# Patient Record
Sex: Male | Born: 1985 | Race: White | Hispanic: No | Marital: Married | Smoking: Never smoker
Health system: Southern US, Community
[De-identification: ages and names within clinical notes are randomized; demographics above are authoritative.]

## PROBLEM LIST (undated history)

## (undated) DIAGNOSIS — I10 Essential (primary) hypertension: Secondary | ICD-10-CM

## (undated) DIAGNOSIS — G43909 Migraine, unspecified, not intractable, without status migrainosus: Secondary | ICD-10-CM

## (undated) DIAGNOSIS — M109 Gout, unspecified: Secondary | ICD-10-CM

## (undated) HISTORY — DX: Essential (primary) hypertension: I10

## (undated) HISTORY — DX: Gout, unspecified: M10.9

## (undated) HISTORY — DX: Migraine, unspecified, not intractable, without status migrainosus: G43.909

## (undated) HISTORY — PX: ARTHOSCOPIC ROTAOR CUFF REPAIR: SHX5002

---

## 2019-12-13 ENCOUNTER — Ambulatory Visit: Payer: 59 | Admitting: Family

## 2019-12-30 ENCOUNTER — Other Ambulatory Visit: Payer: Self-pay

## 2019-12-30 ENCOUNTER — Ambulatory Visit (INDEPENDENT_AMBULATORY_CARE_PROVIDER_SITE_OTHER): Payer: 59 | Admitting: Family

## 2019-12-30 ENCOUNTER — Encounter: Payer: Self-pay | Admitting: Family

## 2019-12-30 VITALS — Ht 66.0 in | Wt 290.0 lb

## 2019-12-30 DIAGNOSIS — R0683 Snoring: Secondary | ICD-10-CM

## 2019-12-30 DIAGNOSIS — G4730 Sleep apnea, unspecified: Secondary | ICD-10-CM | POA: Insufficient documentation

## 2019-12-30 DIAGNOSIS — R519 Headache, unspecified: Secondary | ICD-10-CM | POA: Insufficient documentation

## 2019-12-30 DIAGNOSIS — K429 Umbilical hernia without obstruction or gangrene: Secondary | ICD-10-CM | POA: Diagnosis not present

## 2019-12-30 DIAGNOSIS — R229 Localized swelling, mass and lump, unspecified: Secondary | ICD-10-CM

## 2019-12-30 DIAGNOSIS — Z807 Family history of other malignant neoplasms of lymphoid, hematopoietic and related tissues: Secondary | ICD-10-CM

## 2019-12-30 DIAGNOSIS — Z7689 Persons encountering health services in other specified circumstances: Secondary | ICD-10-CM

## 2019-12-30 HISTORY — DX: Headache, unspecified: R51.9

## 2019-12-30 NOTE — Assessment & Plan Note (Signed)
Exam certainly limited as exam was conducted in a virtual environment.  Based on description, hernia does not appear to be obstructed or strangulated.  He is not having any severe pain.  I have advised him to be vigilant in regards to pain, redness, increased heat or other concerns for infection and to let us know immediately. Referral is in place to surgery for further evaluation

## 2019-12-30 NOTE — Assessment & Plan Note (Signed)
Agree with patient that the sleep study evaluation is appropriate.  Particularly in the setting of his headaches, nonrestorative sleep.  Referral is in place to pulmonology.  Will follow

## 2019-12-30 NOTE — Progress Notes (Signed)
Virtual Visit via Video Note  I connected with@  on 12/30/19 at  3:00 PM EST by a video enabled telemedicine application and verified that I am speaking with the correct person using two identifiers.  Location patient: home Location provider:work  Persons participating in the virtual visit: patient, provider  I discussed the limitations of evaluation and management by telemedicine and the availability of in person appointments. The patient expressed understanding and agreed to proceed.   HPI: Establish care  Concerned for umbilical hernia, noticed one year ago.  No severe pain, erythema, fever, purulent discharge. Protrudes when cough. Reducible per patient. Can really tell is it there, it 'is not painful.' Concerned that he snores and 'has sleep apnea'. Doesn't feel restored after sleep, also has headaches.  H/o migraine and has followed with neurology in the past. HA may start posterior and radiate to the front, or may feel frontal. No vision changes, aura. Endorses N, v. Will have one migraine per week.  Has had MRI Brain, CT Head and 'have been normal. '  HA is not positional , nor awaken him from sleep.  HA resolves with rest, sitting in dark room. Uses excredrin migraine with relief.   Concerned for 'cyst ' on back, noticed for 3 months. Has been there for some time.  No purulent discharge.   H/o HTN in highschool ; had been on HCTZ in the past. hasnt checked blood pressure recently.  Denies exertional chest pain or pressure, numbness or tingling radiating to left arm or jaw, palpitations, dizziness, hanges in vision, or shortness of breath.   At urgent care last year, BP 137/87.  He also shared that his paternal grandfather has a history of multiple myeloma and his father history of elevated ' M protein'.  He would like to know how he can be screened for this.  Denies bone pain.   ROS: See pertinent positives and negatives per HPI.  Past Medical History:  Diagnosis Date  .  Hypertension   . Migraines     Past Surgical History:  Procedure Laterality Date  . ARTHOSCOPIC ROTAOR CUFF REPAIR Left     Family History  Problem Relation Age of Onset  . Diabetes Mother   . Sleep apnea Mother   . Hypertension Father        elevated M proteins  . Hypothyroidism Sister   . Diabetes Maternal Grandmother   . Prostate cancer Maternal Grandfather   . Skin cancer Paternal Grandmother   . Multiple myeloma Paternal Grandfather     SOCIAL HX: never smoker  No current outpatient medications on file.  EXAM:  VITALS per patient if applicable:  GENERAL: alert, oriented, appears well and in no acute distress  HEENT: atraumatic, conjunttiva clear, no obvious abnormalities on inspection of external nose and ears  NECK: normal movements of the head and neck  LUNGS: on inspection no signs of respiratory distress, breathing rate appears normal, no obvious gross SOB, gasping or wheezing  CV: no obvious cyanosis  MS: moves all visible extremities without noticeable abnormality  PSYCH/NEURO: pleasant and cooperative, no obvious depression or anxiety, speech and thought processing grossly intact  ASSESSMENT AND PLAN:  Discussed the following assessment and plan:  Encounter to establish care  Umbilical hernia without obstruction and without gangrene - Plan: Ambulatory referral to General Surgery  Snores - Plan: Ambulatory referral to Pulmonology  Skin mass - Plan: Ambulatory referral to General Surgery  Family history of multiple myeloma  Nonintractable headache, unspecified chronicity pattern,  unspecified headache type Problem List Items Addressed This Visit      Other   Encounter to establish care - Primary    Reviewed past medical and family history with patient today      Family history of multiple myeloma    PGM had multiple myeloma.  Advised that we could add labs onto CPE. Have sent Dr Tasia Catchings a message in regards to surveillance, appropriate labs       Headache    History of migraine and has seen neurology in the past.   declines any medication at this time.  He would like to further understand the reason behind his headaches and he is concerned for sleep apnea.  .  We will follow up after he has had a sleep study.      Skin mass    Based on location of mass, patient is unable to show me this with his phone.  Description is vague however there has not been a change in size and there are concern for infections based on pain, redness, increased heat or discharge.  He will ask his wife to stay vigilant in regards to this.  Discussed whether this could be a sebaceous cyst.  At this time, patient is already going to general surgery for evaluation of hernia, we felt was appropriate for surgeon to evaluate suspected cyst ( ? Lipoma)  as well as hernia.  Will follow      Relevant Orders   Ambulatory referral to General Surgery   Snores    Agree with patient that the sleep study evaluation is appropriate.  Particularly in the setting of his headaches, nonrestorative sleep.  Referral is in place to pulmonology.  Will follow      Relevant Orders   Ambulatory referral to Pulmonology   Umbilical hernia    Exam certainly limited as exam was conducted in a virtual environment.  Based on description, hernia does not appear to be obstructed or strangulated.  He is not having any severe pain.  I have advised him to be vigilant in regards to pain, redness, increased heat or other concerns for infection and to let us know immediately. Referral is in place to surgery for further evaluation      Relevant Orders   Ambulatory referral to General Surgery      -we discussed possible serious and likely etiologies, options for evaluation and workup, limitations of telemedicine visit vs in person visit, treatment, treatment risks and precautions. Pt prefers to treat via telemedicine empirically rather then risking or undertaking an in person visit at this moment.  Patient agrees to seek prompt in person care if worsening, new symptoms arise, or if is not improving with treatment.   I discussed the assessment and treatment plan with the patient. The patient was provided an opportunity to ask questions and all were answered. The patient agreed with the plan and demonstrated an understanding of the instructions.   The patient was advised to call back or seek an in-person evaluation if the symptoms worsen or if the condition fails to improve as anticipated.   Mable Paris, FNP

## 2019-12-30 NOTE — Assessment & Plan Note (Signed)
History of migraine and has seen neurology in the past.   declines any medication at this time.  He would like to further understand the reason behind his headaches and he is concerned for sleep apnea.  .  We will follow up after he has had a sleep study.

## 2019-12-30 NOTE — Assessment & Plan Note (Signed)
Based on location of mass, patient is unable to show me this with his phone.  Description is vague however there has not been a change in size and there are concern for infections based on pain, redness, increased heat or discharge.  He will ask his wife to stay vigilant in regards to this.  Discussed whether this could be a sebaceous cyst.  At this time, patient is already going to general surgery for evaluation of hernia, we felt was appropriate for surgeon to evaluate suspected cyst ( ? Lipoma)  as well as hernia.  Will follow

## 2019-12-30 NOTE — Assessment & Plan Note (Addendum)
PGM had multiple myeloma.  Advised that we could add labs onto CPE. Have sent Dr Tasia Catchings a message in regards to surveillance, appropriate labs

## 2019-12-30 NOTE — Assessment & Plan Note (Signed)
Reviewed past medical and family history with patient today

## 2020-01-04 ENCOUNTER — Other Ambulatory Visit: Payer: Self-pay

## 2020-01-04 ENCOUNTER — Ambulatory Visit
Admission: EM | Admit: 2020-01-04 | Discharge: 2020-01-04 | Disposition: A | Discharge status: Home / Self Care | Payer: 59 | Attending: Emergency Medicine | Admitting: Emergency Medicine

## 2020-01-04 DIAGNOSIS — M25562 Pain in left knee: Secondary | ICD-10-CM | POA: Diagnosis not present

## 2020-01-04 MED ORDER — PREDNISONE 10 MG (21) PO TBPK: ORAL_TABLET | Freq: Every day | ORAL | 0 refills | Status: DC

## 2020-01-04 NOTE — Discharge Instructions (Signed)
Take the prednisone as directed.  Continue to take ibuprofen as needed for discomfort.    Rest and elevate your hand.  Apply ice packs 2-3 times a day for up to 20 minutes each.  Wear the Ace wrap.    Follow up with your primary care provider or an orthopedist if you symptoms continue or worsen;  Or if you develop new symptoms, such as numbness, tingling, or weakness.

## 2020-01-04 NOTE — ED Provider Notes (Addendum)
Michael Hardin    CSN: [id number] Arrival date & time: 01/04/20  1004      History   Chief Complaint Chief Complaint  Patient presents with  . Knee Pain    HPI Michael Hardin is a 34 y.o. male.   Patient presents with left knee pain x2 days.  He states he awoke to sudden sharp pain on Saturday night.  He denies falls or injury or known cause.  He denies numbness, tingling, weakness.  He states the pain is 7/10, sharp, constant, worse with bending and movement, improves with rest.  Treatment attempted at home with ibuprofen.  The history is provided by the patient.    Past Medical History:  Diagnosis Date  . Hypertension   . Migraines     Patient Active Problem List   Diagnosis Date Noted  . Umbilical hernia 59/16/3846  . Snores 12/30/2019  . Skin mass 12/30/2019  . Family history of multiple myeloma 12/30/2019  . Encounter to establish care 12/30/2019  . Headache 12/30/2019    Past Surgical History:  Procedure Laterality Date  . ARTHOSCOPIC ROTAOR CUFF REPAIR Left        Home Medications    Prior to Admission medications   Medication Sig Start Date End Date Taking? Authorizing Provider  predniSONE (STERAPRED UNI-PAK 21 TAB) 10 MG (21) TBPK tablet Take by mouth daily. Take 6 tabs by mouth daily  for 1 day, then 5 tabs for 1 day, then 4 tabs for 1 day, then 3 tabs for 1 day, 2 tabs for 1 day, then 1 tab by mouth daily for 1 day 01/04/20   Sharion Balloon, NP    Family History Family History  Problem Relation Age of Onset  . Diabetes Mother   . Sleep apnea Mother   . Hypertension Father        elevated M proteins  . Hypothyroidism Sister   . Diabetes Maternal Grandmother   . Prostate cancer Maternal Grandfather   . Skin cancer Paternal Grandmother   . Multiple myeloma Paternal Grandfather     Social History Social History   Tobacco Use  . Smoking status: Never Smoker  . Smokeless tobacco: Never Used  Substance Use Topics  . Alcohol use: Not  Currently  . Drug use: Not Currently     Allergies   Patient has no known allergies.   Review of Systems Review of Systems  Constitutional: Negative for chills and fever.  HENT: Negative for ear pain and sore throat.   Eyes: Negative for pain and visual disturbance.  Respiratory: Negative for cough and shortness of breath.   Cardiovascular: Negative for chest pain and palpitations.  Gastrointestinal: Negative for abdominal pain and vomiting.  Genitourinary: Negative for dysuria and hematuria.  Musculoskeletal: Positive for arthralgias. Negative for back pain.  Skin: Negative for color change and rash.  Neurological: Negative for seizures, syncope, weakness and numbness.  All other systems reviewed and are negative.    Physical Exam Triage Vital Signs ED Triage Vitals  Enc Vitals Group     BP      Pulse      Resp      Temp      Temp src      SpO2      Weight      Height      Head Circumference      Peak Flow      Pain Score      Pain Loc  Pain Edu?      Excl. in [place]?    No data found.  Updated Vital Signs BP (!) 141/93 (BP Location: Left Arm)   Pulse 73   Temp 98.3 F (36.8 C) (Oral)   Resp 18   Ht 5' 6"  (1.676 m)   Wt 300 lb (136.1 kg)   SpO2 97%   BMI 48.42 kg/m   Visual Acuity Right Eye Distance:   Left Eye Distance:   Bilateral Distance:    Right Eye Near:   Left Eye Near:    Bilateral Near:     Physical Exam Vitals and nursing note reviewed.  Constitutional:      Appearance: He is well-developed.  HENT:     Head: Normocephalic and atraumatic.     Mouth/Throat:     Mouth: Mucous membranes are moist.  Eyes:     Conjunctiva/sclera: Conjunctivae normal.  Cardiovascular:     Rate and Rhythm: Normal rate and regular rhythm.     Heart sounds: No murmur.  Pulmonary:     Effort: Pulmonary effort is normal. No respiratory distress.     Breath sounds: Normal breath sounds.  Abdominal:     General: Bowel sounds are normal.      Palpations: Abdomen is soft.     Tenderness: There is no abdominal tenderness. There is no guarding or rebound.  Musculoskeletal:        General: Tenderness present. No swelling, deformity or signs of injury. Normal range of motion.     Cervical back: Neck supple.     Left knee: No swelling, deformity, effusion, erythema or ecchymosis. Tenderness present.     Right lower leg: No edema.     Left lower leg: No edema.       Legs:  Skin:    General: Skin is warm and dry.     Capillary Refill: Capillary refill takes less than 2 seconds.     Findings: No bruising, erythema, lesion or rash.  Neurological:     General: No focal deficit present.     Mental Status: He is alert and oriented to person, place, and time.     Sensory: No sensory deficit.     Motor: No weakness.  Psychiatric:        Mood and Affect: Mood normal.        Behavior: Behavior normal.      UC Treatments / Results  Labs (all labs ordered are listed, but only abnormal results are displayed) Labs Reviewed - No data to display  EKG   Radiology No results found.  Procedures Procedures (including critical care time)  Medications Ordered in UC Medications - No data to display  Initial Impression / Assessment and Plan / UC Course  I have reviewed the triage vital signs and the nursing notes.  Pertinent labs & imaging results that were available during my care of the patient were reviewed by me and considered in my medical decision making (see chart for details).    Left knee pain.  Treating with prednisone, Ace wrap, rest, elevation, ice packs.  Instructed patient to continue taking ibuprofen as needed for discomfort.  Instructed him to follow-up with his PCP or an orthopedist if his symptoms persist or he develops new symptoms such as numbness, weakness, paresthesias.  Patient agrees to plan of care.     Final Clinical Impressions(s) / UC Diagnoses   Final diagnoses:  Acute pain of left knee      Discharge Instructions  Take the prednisone as directed.  Continue to take ibuprofen as needed for discomfort.    Rest and elevate your hand.  Apply ice packs 2-3 times a day for up to 20 minutes each.  Wear the Ace wrap.    Follow up with your primary care provider or an orthopedist if you symptoms continue or worsen;  Or if you develop new symptoms, such as numbness, tingling, or weakness.         ED Prescriptions    Medication Sig Dispense Auth. Provider   predniSONE (STERAPRED UNI-PAK 21 TAB) 10 MG (21) TBPK tablet Take by mouth daily. Take 6 tabs by mouth daily  for 1 day, then 5 tabs for 1 day, then 4 tabs for 1 day, then 3 tabs for 1 day, 2 tabs for 1 day, then 1 tab by mouth daily for 1 day 21 tablet Sharion Balloon, NP     PDMP not reviewed this encounter.   Sharion Balloon, NP 01/04/20 1036    Sharion Balloon, NP 01/04/20 1124

## 2020-01-04 NOTE — ED Triage Notes (Signed)
Patient states that he started having left knee pain on Saturday. Denies any known injury. States that pain is absent if knee is straight, worsens with bending. States that he has a history of gout and is concerned for this.

## 2020-01-05 ENCOUNTER — Ambulatory Visit (INDEPENDENT_AMBULATORY_CARE_PROVIDER_SITE_OTHER): Payer: 59 | Admitting: Surgery

## 2020-01-05 ENCOUNTER — Encounter: Payer: Self-pay | Admitting: Surgery

## 2020-01-05 ENCOUNTER — Encounter: Payer: Self-pay | Admitting: Family

## 2020-01-05 ENCOUNTER — Other Ambulatory Visit: Payer: Self-pay

## 2020-01-05 VITALS — BP 159/97 | HR 99 | Temp 97.0°F | Resp 16 | Ht 63.0 in | Wt 306.0 lb

## 2020-01-05 DIAGNOSIS — K429 Umbilical hernia without obstruction or gangrene: Secondary | ICD-10-CM

## 2020-01-05 DIAGNOSIS — L723 Sebaceous cyst: Secondary | ICD-10-CM | POA: Diagnosis not present

## 2020-01-05 NOTE — Patient Instructions (Addendum)
Try to maintain a healthy weight to see if this helps with the discomfort. If you decide to move forward with surgery let us know and we can get this scheduled for you.  Please call our office if you have any questions or concerns.  You may apply warm compresses to the area on your upper back.

## 2020-01-05 NOTE — Progress Notes (Signed)
Patient ID: Michael Hardin, male   DOB: [birth year], 34 y.o.   MRN: [id number]  Chief Complaint:   Umbilical hernia, dermal lesion on back.  History of Present Illness Michael Hardin is a 34 y.o. male with a year and a half history of discomfort notable at the umbilical region.  No changes in bowel function, denies history of constipation, diarrhea, fever, nausea.  Pain does not seem to be associated with any particular function or activity.  He denies any association to cough or sneezing.  Would describe it is more of a discomfort than a sharp pain. Also has a history of a dermal lesion of his right upper back for approximately 3 months.  He denies drainage, has attempted manipulation without alleviation.  Otherwise asymptomatic, nontender no itching, no discoloration, no rash.  Past Medical History Past Medical History:  Diagnosis Date  . Hypertension   . Migraines       Past Surgical History:  Procedure Laterality Date  . ARTHOSCOPIC ROTAOR CUFF REPAIR Left     No Known Allergies  Current Outpatient Medications  Medication Sig Dispense Refill  . predniSONE (STERAPRED UNI-PAK 21 TAB) 10 MG (21) TBPK tablet Take by mouth daily. Take 6 tabs by mouth daily  for 1 day, then 5 tabs for 1 day, then 4 tabs for 1 day, then 3 tabs for 1 day, 2 tabs for 1 day, then 1 tab by mouth daily for 1 day 21 tablet 0   No current facility-administered medications for this visit.    Family History Family History  Problem Relation Age of Onset  . Diabetes Mother   . Sleep apnea Mother   . Hypertension Father        elevated M proteins  . Hypothyroidism Sister   . Diabetes Maternal Grandmother   . Prostate cancer Maternal Grandfather   . Skin cancer Paternal Grandmother   . Multiple myeloma Paternal Grandfather       Social History Social History   Tobacco Use  . Smoking status: Never Smoker  . Smokeless tobacco: Never Used  Substance Use Topics  . Alcohol use: Not Currently  . Drug use:  Not Currently        Review of Systems  Constitutional: Negative for chills, fever, malaise/fatigue and weight loss.  HENT: Negative.   Eyes: Negative.   Respiratory: Negative.   Cardiovascular: Negative.   Gastrointestinal: Negative for abdominal pain, constipation, diarrhea, nausea and vomiting.  Genitourinary: Negative.   Musculoskeletal: Positive for joint pain.  Skin: Negative for itching and rash.  Neurological: Negative.   Endo/Heme/Allergies: Negative.       Physical Exam Blood pressure (!) 159/97, pulse 99, temperature (!) 97 F (36.1 C), temperature source Temporal, resp. rate 16, height '5\' 3"'$  (1.6 m), weight (!) 306 lb (138.8 kg), SpO2 97 %. Last Weight  Most recent update: 01/05/2020  9:22 AM   Weight  138.8 kg (306 lb)              CONSTITUTIONAL: Well developed, and nourished, appropriately responsive and aware without distress.  Morbidly obese. EYES: Sclera non-icteric.   EARS, NOSE, MOUTH AND THROAT: Mask worn.   Hearing is intact to voice.  NECK: Trachea is midline.  LYMPH NODES:  Lymph nodes in the neck are not enlarged. RESPIRATORY:  Lungs are clear, and breath sounds are equal bilaterally. Normal respiratory effort without pathologic use of accessory muscles. CARDIOVASCULAR: Heart is regular in rate and rhythm. GI: The abdomen is well rounded,  soft, nontender, and nondistended.  There is a defect at the umbilical region causing the crescents shaped opening, with asymmetry of approximately a 1.5 cm fleshy lump.  This was tender on attempts to palpate the underlying fascial defect, consistent with an incarcerated preperitoneal lipoma.  There was associated diastases recti.  There were no other palpable masses. I did not appreciate hepatosplenomegaly. There were normal bowel sounds. MUSCULOSKELETAL:  Symmetrical muscle tone appreciated in all four extremities.    SKIN: Skin turgor is normal. No pathologic skin lesions appreciated.  There is a subcentimeter dermal  cyst on the right parascapular area without evidence of inflammatory changes, or overlying skin changes. NEUROLOGIC:  Motor and sensation appear grossly normal.  Cranial nerves are grossly without defect. PSYCH:  Alert and oriented to person, place and time. Affect is appropriate for situation.  Data Reviewed I have personally reviewed what is currently available of the patient's imaging, recent labs and medical records.   Labs:  No flowsheet data found. No flowsheet data found.    Imaging:  Within last 24 hrs: No results found.  Assessment    Small, symptomatic umbilical hernia defect. Dermal cyst, likely sebaceous right parascapular area subcentimeter in size. Patient Active Problem List   Diagnosis Date Noted  . Umbilical hernia 56/38/7564  . Snores 12/30/2019  . Skin mass 12/30/2019  . Family history of multiple myeloma 12/30/2019  . Encounter to establish care 12/30/2019  . Headache 12/30/2019    Plan    We discussed avoidance of manipulation of the cyst.  We discussed that weight loss will likely diminish the symptomatic nature of the rather small umbilical fascial defect.  We also discussed surgery to obliterate the defect with suture, likely not to warrant any utilization of prosthetics i.e. mesh.  He would like to pursue both of these options and defer surgery for now.  He would like to pursue weight loss as a general health measure and we encouraged him in this.  This may also be of some assistance with his knee pain.  Face-to-face time spent with the patient and accompanying care providers(if present) was 30 minutes, with more than 50% of the time spent counseling, educating, and coordinating care of the patient.      Ronny Bacon M.D., FACS 01/05/2020, 9:56 AM

## 2020-01-06 ENCOUNTER — Other Ambulatory Visit: Payer: Self-pay

## 2020-01-06 MED ORDER — AMLODIPINE BESYLATE 2.5 MG PO TABS: 2.5000 mg | ORAL_TABLET | Freq: Every day | ORAL | 2 refills | Status: DC

## 2020-01-10 ENCOUNTER — Telehealth: Payer: Self-pay | Admitting: Family

## 2020-01-10 DIAGNOSIS — Z807 Family history of other malignant neoplasms of lymphoid, hematopoietic and related tissues: Secondary | ICD-10-CM

## 2020-01-10 DIAGNOSIS — Z7689 Persons encountering health services in other specified circumstances: Secondary | ICD-10-CM

## 2020-01-10 NOTE — Telephone Encounter (Signed)
Call pt  I know he has f/u 2/22 Would he be amenable to labs prior too if he can?   These are fasting and will give [place] baseline.  Also, I consulted with oncology in regards to his family h/o multiple myeloma who agreed that screen appropriate so I have included panel for multiple myeloma.

## 2020-01-11 NOTE — Telephone Encounter (Signed)
LMTCB to get patient scheduled for fasting labs.  

## 2020-01-12 ENCOUNTER — Other Ambulatory Visit (INDEPENDENT_AMBULATORY_CARE_PROVIDER_SITE_OTHER): Payer: 59

## 2020-01-12 ENCOUNTER — Other Ambulatory Visit: Payer: Self-pay

## 2020-01-12 DIAGNOSIS — Z807 Family history of other malignant neoplasms of lymphoid, hematopoietic and related tissues: Secondary | ICD-10-CM

## 2020-01-12 DIAGNOSIS — Z7689 Persons encountering health services in other specified circumstances: Secondary | ICD-10-CM

## 2020-01-12 LAB — CBC WITH DIFFERENTIAL/PLATELET
Basophils Absolute: 0 10*3/uL (ref 0.0–0.1)
Basophils Relative: 0.5 % (ref 0.0–3.0)
Eosinophils Absolute: 0.2 10*3/uL (ref 0.0–0.7)
Eosinophils Relative: 2 % (ref 0.0–5.0)
HCT: 49.8 % (ref 39.0–52.0)
Hemoglobin: 17.2 g/dL — ABNORMAL HIGH (ref 13.0–17.0)
Lymphocytes Relative: 27.5 % (ref 12.0–46.0)
Lymphs Abs: 2.5 10*3/uL (ref 0.7–4.0)
MCHC: 34.5 g/dL (ref 30.0–36.0)
MCV: 89.8 fl (ref 78.0–100.0)
Monocytes Absolute: 0.9 10*3/uL (ref 0.1–1.0)
Monocytes Relative: 10.1 % (ref 3.0–12.0)
Neutro Abs: 5.4 10*3/uL (ref 1.4–7.7)
Neutrophils Relative %: 59.9 % (ref 43.0–77.0)
Platelets: 210 10*3/uL (ref 150.0–400.0)
RBC: 5.55 Mil/uL (ref 4.22–5.81)
RDW: 14.3 % (ref 11.5–15.5)
WBC: 9.1 10*3/uL (ref 4.0–10.5)

## 2020-01-12 LAB — COMPREHENSIVE METABOLIC PANEL
ALT: 30 U/L (ref 0–53)
AST: 13 U/L (ref 0–37)
Albumin: 4.3 g/dL (ref 3.5–5.2)
Alkaline Phosphatase: 67 U/L (ref 39–117)
BUN: 13 mg/dL (ref 6–23)
CO2: 29 mEq/L (ref 19–32)
Calcium: 9.5 mg/dL (ref 8.4–10.5)
Chloride: 101 mEq/L (ref 96–112)
Creatinine, Ser: 0.82 mg/dL (ref 0.40–1.50)
GFR: 109.21 mL/min (ref >60.00)
Glucose, Bld: 89 mg/dL (ref 70–99)
Potassium: 3.8 mEq/L (ref 3.5–5.1)
Sodium: 139 mEq/L (ref 135–145)
Total Bilirubin: 0.8 mg/dL (ref 0.2–1.2)
Total Protein: 7.3 g/dL (ref 6.0–8.3)

## 2020-01-12 LAB — LIPID PANEL
Cholesterol: 164 mg/dL (ref 0–200)
HDL: 37.7 mg/dL — ABNORMAL LOW (ref >39.00)
LDL Cholesterol: 91 mg/dL (ref 0–99)
NonHDL: 126.64
Total CHOL/HDL Ratio: 4
Triglycerides: 180 mg/dL — ABNORMAL HIGH (ref 0.0–149.0)
VLDL: 36 mg/dL (ref 0.0–40.0)

## 2020-01-12 LAB — HEMOGLOBIN A1C: Hgb A1c MFr Bld: 5.2 % (ref 4.6–6.5)

## 2020-01-12 LAB — VITAMIN D 25 HYDROXY (VIT D DEFICIENCY, FRACTURES): VITD: 20.57 ng/mL — ABNORMAL LOW (ref 30.00–100.00)

## 2020-01-12 LAB — TSH: TSH: 2.17 u[IU]/mL (ref 0.35–4.50)

## 2020-01-12 NOTE — Telephone Encounter (Signed)
I called patient & he actually had labs done this morning.

## 2020-01-18 ENCOUNTER — Encounter: Payer: Self-pay | Admitting: Family

## 2020-01-18 ENCOUNTER — Ambulatory Visit (INDEPENDENT_AMBULATORY_CARE_PROVIDER_SITE_OTHER): Payer: 59 | Admitting: Family

## 2020-01-18 ENCOUNTER — Other Ambulatory Visit: Payer: Self-pay

## 2020-01-18 DIAGNOSIS — I10 Essential (primary) hypertension: Secondary | ICD-10-CM | POA: Insufficient documentation

## 2020-01-18 NOTE — Assessment & Plan Note (Signed)
Stable, improved. Tolerating amlodipine. Discussed weight loss and potentially coming off of medication. Patient will let me know of any concerns.

## 2020-01-18 NOTE — Progress Notes (Signed)
Subjective:    Patient ID: Michael Hardin, male    DOB: 1986/06/25, 34 y.o.   MRN: [id number]  CC: Michael Hardin is a 34 y.o. male who presents today for follow up.   HPI: Blood pressure follow up.   Feels well today, no concerns. Compliant with amlodipine. BP at home is 130/80. He has ordered a larger size BP cuff.   Denies exertional chest pain or pressure, numbness or tingling radiating to left arm or jaw, palpitations, dizziness, frequent headaches, changes in vision, or shortness of breath.   Upcoming appointment with pulmonology     HISTORY:  Past Medical History:  Diagnosis Date  . Gout    in ankle  . Hypertension   . Migraines    Past Surgical History:  Procedure Laterality Date  . ARTHOSCOPIC ROTAOR CUFF REPAIR Left    Family History  Problem Relation Age of Onset  . Diabetes Mother   . Sleep apnea Mother   . Hypertension Father        elevated M proteins  . Hypothyroidism Sister   . Diabetes Maternal Grandmother   . Prostate cancer Maternal Grandfather   . Skin cancer Paternal Grandmother   . Multiple myeloma Paternal Grandfather     Allergies: Patient has no known allergies. Current Outpatient Medications on File Prior to Visit  Medication Sig Dispense Refill  . amLODipine (NORVASC) 2.5 MG tablet Take 1 tablet (2.5 mg total) by mouth daily. 30 tablet 2   No current facility-administered medications on file prior to visit.    Social History   Tobacco Use  . Smoking status: Never Smoker  . Smokeless tobacco: Never Used  Substance Use Topics  . Alcohol use: Not Currently  . Drug use: Not Currently    Review of Systems  Constitutional: Negative for chills and fever.  Respiratory: Negative for cough.   Cardiovascular: Negative for chest pain and palpitations.  Gastrointestinal: Negative for nausea and vomiting.      Objective:    BP 100/70   Pulse 76   Temp (!) 97.4 F (36.3 C) (Temporal)   Ht 5' 6"  (1.676 m)   Wt (!) 306 lb 12.8 oz  (139.2 kg)   SpO2 97%   BMI 49.52 kg/m  BP Readings from Last 3 Encounters:  01/18/20 100/70  01/05/20 (!) 159/97  01/04/20 (!) 141/93   Wt Readings from Last 3 Encounters:  01/18/20 (!) 306 lb 12.8 oz (139.2 kg)  01/05/20 (!) 306 lb (138.8 kg)  01/04/20 300 lb (136.1 kg)    Physical Exam Vitals reviewed.  Constitutional:      Appearance: He is well-developed.  Cardiovascular:     Rate and Rhythm: Regular rhythm.     Heart sounds: Normal heart sounds.  Pulmonary:     Effort: Pulmonary effort is normal. No respiratory distress.     Breath sounds: Normal breath sounds. No wheezing, rhonchi or rales.  Skin:    General: Skin is warm and dry.  Neurological:     Mental Status: He is alert.  Psychiatric:        Speech: Speech normal.        Behavior: Behavior normal.        Assessment & Plan:   Problem List Items Addressed This Visit      Cardiovascular and Mediastinum   HTN (hypertension)    Stable, improved. Tolerating amlodipine. Discussed weight loss and potentially coming off of medication. Patient will let me know of any concerns.  I have discontinued Michael Hardin's predniSONE. I am also having him maintain his amLODipine.   No orders of the defined types were placed in this encounter.   Return precautions given.   Risks, benefits, and alternatives of the medications and treatment plan prescribed today were discussed, and patient expressed understanding.   Education regarding symptom management and diagnosis given to patient on AVS.  Continue to follow with Michael Hawthorne, FNP for routine health maintenance.   Michael Hardin and I agreed with plan.   Michael Paris, FNP

## 2020-01-18 NOTE — Patient Instructions (Signed)
Your vitamin D level is low.  You may add over the counter vitamin D 1000 units by mouth daily for 12 weeks. Then you may call our office for a follow up visit and we can recheck level.   Blood pressure looks excellent.   Nice to see you!    Managing Your Hypertension Hypertension is commonly called high blood pressure. This is when the force of your blood pressing against the walls of your arteries is too strong. Arteries are blood vessels that carry blood from your heart throughout your body. Hypertension forces the heart to work harder to pump blood, and may cause the arteries to become narrow or stiff. Having untreated or uncontrolled hypertension can cause heart attack, stroke, kidney disease, and other problems. What are blood pressure readings? A blood pressure reading consists of a higher number over a lower number. Ideally, your blood pressure should be below 120/80. The first ("top") number is called the systolic pressure. It is a measure of the pressure in your arteries as your heart beats. The second ("bottom") number is called the diastolic pressure. It is a measure of the pressure in your arteries as the heart relaxes. What does my blood pressure reading mean? Blood pressure is classified into four stages. Based on your blood pressure reading, your health care provider may use the following stages to determine what type of treatment you need, if any. Systolic pressure and diastolic pressure are measured in a unit called mm Hg. Normal  Systolic pressure: below 798.  Diastolic pressure: below 80. Elevated  Systolic pressure: 921-194.  Diastolic pressure: below 80. Hypertension stage 1  Systolic pressure: 174-081.  Diastolic pressure: 44-81. Hypertension stage 2  Systolic pressure: 856 or above.  Diastolic pressure: 90 or above. What health risks are associated with hypertension? Managing your hypertension is an important responsibility. Uncontrolled hypertension can  lead to:  A heart attack.  A stroke.  A weakened blood vessel (aneurysm).  Heart failure.  Kidney damage.  Eye damage.  Metabolic syndrome.  Memory and concentration problems. What changes can I make to manage my hypertension? Hypertension can be managed by making lifestyle changes and possibly by taking medicines. Your health care provider will help you make a plan to bring your blood pressure within a normal range. Eating and drinking   Eat a diet that is high in fiber and potassium, and low in salt (sodium), added sugar, and fat. An example eating plan is called the DASH (Dietary Approaches to Stop Hypertension) diet. To eat this way: ? Eat plenty of fresh fruits and vegetables. Try to fill half of your plate at each meal with fruits and vegetables. ? Eat whole grains, such as whole wheat pasta, brown rice, or whole grain bread. Fill about one quarter of your plate with whole grains. ? Eat low-fat diary products. ? Avoid fatty cuts of meat, processed or cured meats, and poultry with skin. Fill about one quarter of your plate with lean proteins such as fish, chicken without skin, beans, eggs, and tofu. ? Avoid premade and processed foods. These tend to be higher in sodium, added sugar, and fat.  Reduce your daily sodium intake. Most people with hypertension should eat less than 1,500 mg of sodium a day.  Limit alcohol intake to no more than 1 drink a day for nonpregnant women and 2 drinks a day for men. One drink equals 12 oz of beer, 5 oz of wine, or 1 oz of hard liquor. Lifestyle  Work with  your health care provider to maintain a healthy body weight, or to lose weight. Ask what an ideal weight is for you.  Get at least 30 minutes of exercise that causes your heart to beat faster (aerobic exercise) most days of the week. Activities may include walking, swimming, or biking.  Include exercise to strengthen your muscles (resistance exercise), such as weight lifting, as part of  your weekly exercise routine. Try to do these types of exercises for 30 minutes at least 3 days a week.  Do not use any products that contain nicotine or tobacco, such as cigarettes and e-cigarettes. If you need help quitting, ask your health care provider.  Control any long-term (chronic) conditions you have, such as high cholesterol or diabetes. Monitoring  Monitor your blood pressure at home as told by your health care provider. Your personal target blood pressure may vary depending on your medical conditions, your age, and other factors.  Have your blood pressure checked regularly, as often as told by your health care provider. Working with your health care provider  Review all the medicines you take with your health care provider because there may be side effects or interactions.  Talk with your health care provider about your diet, exercise habits, and other lifestyle factors that may be contributing to hypertension.  Visit your health care provider regularly. Your health care provider can help you create and adjust your plan for managing hypertension. Will I need medicine to control my blood pressure? Your health care provider may prescribe medicine if lifestyle changes are not enough to get your blood pressure under control, and if:  Your systolic blood pressure is 130 or higher.  Your diastolic blood pressure is 80 or higher. Take medicines only as told by your health care provider. Follow the directions carefully. Blood pressure medicines must be taken as prescribed. The medicine does not work as well when you skip doses. Skipping doses also puts you at risk for problems. Contact a health care provider if:  You think you are having a reaction to medicines you have taken.  You have repeated (recurrent) headaches.  You feel dizzy.  You have swelling in your ankles.  You have trouble with your vision. Get help right away if:  You develop a severe headache or confusion.   You have unusual weakness or numbness, or you feel faint.  You have severe pain in your chest or abdomen.  You vomit repeatedly.  You have trouble breathing. Summary  Hypertension is when the force of blood pumping through your arteries is too strong. If this condition is not controlled, it may put you at risk for serious complications.  Your personal target blood pressure may vary depending on your medical conditions, your age, and other factors. For most people, a normal blood pressure is less than 120/80.  Hypertension is managed by lifestyle changes, medicines, or both. Lifestyle changes include weight loss, eating a healthy, low-sodium diet, exercising more, and limiting alcohol. This information is not intended to replace advice given to you by your health care provider. Make sure you discuss any questions you have with your health care provider. Document Revised: 03/06/2019 Document Reviewed: 10/10/2016 Elsevier Patient Education  2020 [employer].

## 2020-01-19 LAB — MULTIPLE MYELOMA PANEL, SERUM
Albumin SerPl Elph-Mcnc: 4.2 g/dL (ref 2.9–4.4)
Albumin/Glob SerPl: 1.4 (ref 0.7–1.7)
Alpha 1: 0.3 g/dL (ref 0.0–0.4)
Alpha2 Glob SerPl Elph-Mcnc: 0.9 g/dL (ref 0.4–1.0)
B-Globulin SerPl Elph-Mcnc: 1.1 g/dL (ref 0.7–1.3)
Gamma Glob SerPl Elph-Mcnc: 1 g/dL (ref 0.4–1.8)
Globulin, Total: 3.2 g/dL (ref 2.2–3.9)
IgA/Immunoglobulin A, Serum: 203 mg/dL (ref 90–386)
IgG (Immunoglobin G), Serum: 1007 mg/dL (ref 603–1613)
IgM (Immunoglobulin M), Srm: 37 mg/dL (ref 20–172)

## 2020-01-19 LAB — PE AND FLC, SERUM: Total Protein: 7.4 g/dL (ref 6.0–8.5)

## 2020-01-20 ENCOUNTER — Other Ambulatory Visit: Payer: Self-pay | Admitting: Family

## 2020-01-20 ENCOUNTER — Telehealth: Payer: Self-pay | Admitting: Lab

## 2020-01-20 DIAGNOSIS — Z807 Family history of other malignant neoplasms of lymphoid, hematopoietic and related tissues: Secondary | ICD-10-CM

## 2020-01-20 NOTE — Telephone Encounter (Signed)
Called Pt No answer, left VM to call office. Was calling Pt with results also to schedule a Lab visit, all test were not done on last visit.

## 2020-01-22 ENCOUNTER — Other Ambulatory Visit: Payer: Self-pay | Admitting: Family

## 2020-01-22 DIAGNOSIS — Z8739 Personal history of other diseases of the musculoskeletal system and connective tissue: Secondary | ICD-10-CM

## 2020-01-26 ENCOUNTER — Other Ambulatory Visit: Payer: Self-pay

## 2020-01-26 ENCOUNTER — Other Ambulatory Visit (INDEPENDENT_AMBULATORY_CARE_PROVIDER_SITE_OTHER): Payer: 59

## 2020-01-26 DIAGNOSIS — Z8739 Personal history of other diseases of the musculoskeletal system and connective tissue: Secondary | ICD-10-CM | POA: Diagnosis not present

## 2020-01-26 DIAGNOSIS — Z807 Family history of other malignant neoplasms of lymphoid, hematopoietic and related tissues: Secondary | ICD-10-CM

## 2020-01-26 LAB — URIC ACID: Uric Acid, Serum: 10.7 mg/dL — ABNORMAL HIGH (ref 4.0–7.8)

## 2020-02-01 LAB — PE AND FLC, SERUM
A/G Ratio: 1.3 (ref 0.7–1.7)
Albumin ELP: 3.9 g/dL (ref 2.9–4.4)
Alpha 1: 0.2 g/dL (ref 0.0–0.4)
Alpha 2: 0.8 g/dL (ref 0.4–1.0)
Beta: 1.1 g/dL (ref 0.7–1.3)
Gamma Globulin: 0.8 g/dL (ref 0.4–1.8)
Globulin, Total: 3 g/dL (ref 2.2–3.9)
Ig Kappa Free Light Chain: 20 mg/L — ABNORMAL HIGH (ref 3.3–19.4)
Ig Lambda Free Light Chain: 18.5 mg/L (ref 5.7–26.3)
KAPPA/LAMBDA RATIO: 1.08 (ref 0.26–1.65)
Total Protein: 6.9 g/dL (ref 6.0–8.5)

## 2020-02-11 ENCOUNTER — Other Ambulatory Visit: Payer: Self-pay

## 2020-02-11 ENCOUNTER — Ambulatory Visit (INDEPENDENT_AMBULATORY_CARE_PROVIDER_SITE_OTHER): Payer: 59 | Admitting: Internal Medicine

## 2020-02-11 ENCOUNTER — Encounter: Payer: Self-pay | Admitting: Internal Medicine

## 2020-02-11 VITALS — BP 132/84 | HR 106 | Temp 97.1°F | Ht 66.0 in | Wt 311.6 lb

## 2020-02-11 DIAGNOSIS — G4719 Other hypersomnia: Secondary | ICD-10-CM

## 2020-02-11 NOTE — Progress Notes (Signed)
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  Encouraged proper weight management.  Discussed driving precautions and its relationship with hypersomnolence.  Discussed operating dangerous equipment and its relationship with hypersomnolence.  Discussed sleep hygiene, and benefits of a fixed sleep waked time.  The importance of getting eight or more hours of sleep discussed with patient.  Discussed limiting the use of the computer and television before bedtime.  Decrease naps during the day, so night time sleep will become enhanced.  Limit caffeine, and sleep deprivation.  HTN, stroke, and heart failure are potential risk factors.    EPWORTH SLEEP SCORE 11    PAST MEDICAL HISTORY :   has a past medical history of Gout, Hypertension, and Migraines.  has a past surgical history that includes Arthroscopic rotator cuff repair (Left). Prior to Admission medications   Medication Sig Start Date End Date Taking? Authorizing Provider  amLODipine (NORVASC) 2.5 MG tablet Take 1 tablet (2.5 mg total) by mouth daily. 01/06/20   Burnard Hawthorne, FNP   No Known Allergies  FAMILY HISTORY:  family history includes Diabetes in his maternal grandmother and mother; Hypertension in his father; Hypothyroidism in  his sister; Multiple myeloma in his paternal grandfather; Prostate cancer in his maternal grandfather; Skin cancer in his paternal grandmother; Sleep apnea in his mother. SOCIAL HISTORY:  reports that he has never smoked. He has never used smokeless tobacco. He reports previous alcohol use. He reports previous drug use.   Review of Systems:  Gen:  Denies  fever, sweats, chills weight loss +fatigue HEENT: Denies blurred vision, double vision, ear pain, eye pain, hearing loss, nose bleeds, sore throat Cardiac:  No dizziness, chest pain or heaviness, chest tightness,edema, No JVD Resp:   No cough, -sputum production, -shortness of breath,-wheezing, -hemoptysis,  Gi: Denies swallowing difficulty, stomach pain, nausea or vomiting, diarrhea, constipation, bowel incontinence Gu:  Denies bladder incontinence, burning urine Ext:   Denies Joint pain, stiffness or swelling Skin: Denies  skin rash, easy bruising or bleeding or hives Endoc:  Denies polyuria, polydipsia , polyphagia or weight change Psych:   Denies depression, insomnia or hallucinations  Other:  All other systems negative   BP 132/84 (BP Location: Left Arm, Cuff Size: Large)   Pulse (!) 106   Temp (!) 97.1 F (36.2 C) (Temporal)   Ht 5' 6"  (1.676 m)   Wt (!) 311 lb 9.6 oz (141.3 kg)   SpO2 96%   BMI 50.29 kg/m    Physical Examination:   General Appearance: No distress  Neuro:without focal findings,  speech normal,  HEENT: PERRLA, EOM intact.   Pulmonary: normal breath sounds, No wheezing.  CardiovascularNormal S1,S2.  No m/r/g.   Abdomen: Benign, Soft, non-tender. Renal:  No costovertebral tenderness  GU:  Not performed at this time. Endoc: No evident  thyromegaly Skin:   warm, no rashes, no ecchymosis  Extremities: normal, no cyanosis, clubbing. PSYCHIATRIC: Mood, affect within normal limits.   ALL OTHER ROS ARE NEGATIVE    ASSESSMENT AND PLAN SYNOPSIS 34 yo obese white male with excessive daytime sleepiness  with signs and symptoms of OSA Patient will need sleep study for definitve diagnosis  Obesity -recommend significant weight loss -recommend changing diet  Deconditioned state -Recommend increased daily activity and exercise  Hypertension - Sleep apnea can contribute to hypertension, therefore treatment of sleep apnea is important part of hypertension management.     TOTAL TIME 35 mins  COVID-19 EDUCATION: The signs and symptoms of COVID-19 were discussed with the patient and how to seek care for testing.  The importance of social distancing was discussed today. Hand Washing Techniques and avoid touching face was advised.     MEDICATION ADJUSTMENTS/LABS AND TESTS ORDERED: Sleep study    CURRENT MEDICATIONS REVIEWED AT LENGTH WITH PATIENT TODAY   Patient satisfied with Plan of action and management. All questions answered  Follow up in 3 months   Levii Hairfield Patricia Pesa, M.D.  [facility]  Medical Director [place] Director [facility] Cardio-Pulmonary Department

## 2020-02-11 NOTE — Patient Instructions (Signed)
Sleep study Needed for further assessment

## 2020-02-12 ENCOUNTER — Ambulatory Visit: Payer: 59 | Attending: Internal Medicine

## 2020-02-12 DIAGNOSIS — Z23 Encounter for immunization: Secondary | ICD-10-CM

## 2020-02-12 NOTE — Progress Notes (Signed)
   Covid-19 Vaccination Clinic  Name:  Michael Hardin    MRN: [id number] DOB: 12-21-1985  02/12/2020  [name] was observed post Covid-19 immunization for 15 minutes without incident. He was provided with Vaccine Information Sheet and instruction to access the V-Safe system.   [name] was instructed to call 911 with any severe reactions post vaccine: Marland Kitchen Difficulty breathing  . Swelling of face and throat  . A fast heartbeat  . A bad rash all over body  . Dizziness and weakness   Immunizations Administered    Name Date Dose VIS Date Route   Pfizer COVID-19 Vaccine 02/12/2020  8:26 AM 0.3 mL 11/06/2019 Intramuscular   Manufacturer: [employer], [employer]   Lot: [id number]   NDC: [address]-9

## 2020-03-01 ENCOUNTER — Encounter: Payer: Self-pay | Admitting: Family

## 2020-03-01 ENCOUNTER — Other Ambulatory Visit: Payer: Self-pay

## 2020-03-01 ENCOUNTER — Ambulatory Visit (INDEPENDENT_AMBULATORY_CARE_PROVIDER_SITE_OTHER): Payer: 59 | Admitting: Family

## 2020-03-01 VITALS — BP 118/82 | HR 70 | Temp 96.9°F | Ht 66.0 in | Wt 311.2 lb

## 2020-03-01 DIAGNOSIS — R519 Headache, unspecified: Secondary | ICD-10-CM | POA: Diagnosis not present

## 2020-03-01 DIAGNOSIS — M109 Gout, unspecified: Secondary | ICD-10-CM | POA: Insufficient documentation

## 2020-03-01 DIAGNOSIS — E790 Hyperuricemia without signs of inflammatory arthritis and tophaceous disease: Secondary | ICD-10-CM | POA: Diagnosis not present

## 2020-03-01 DIAGNOSIS — I10 Essential (primary) hypertension: Secondary | ICD-10-CM

## 2020-03-01 MED ORDER — ALLOPURINOL 100 MG PO TABS: 100.0000 mg | ORAL_TABLET | Freq: Every day | ORAL | 6 refills | Status: DC

## 2020-03-01 NOTE — Patient Instructions (Signed)
Nice to see you  Please start allopurinol 100mg  and we can titrate from there.   Please schedule repeat uric acid in 6 weeks.

## 2020-03-01 NOTE — Assessment & Plan Note (Signed)
At goal. Continue amlodipine.  

## 2020-03-01 NOTE — Assessment & Plan Note (Signed)
Improved. Agree that undiagnosed OSA may be contributory. Patient declines any neuroimaging at this time as he had CT, MRI Brain in the past per patient. He would like to await possible treatment of OSA. Close follow up.

## 2020-03-01 NOTE — Assessment & Plan Note (Signed)
Joint pain has improved. Start 100mg  allopurinol ( advised to stop 300mg  at home) and he will check uric acid in a few weeks.

## 2020-03-01 NOTE — Progress Notes (Signed)
Subjective:    Patient ID: Michael Hardin, male    DOB: Aug 25, 1986, 34 y.o.   MRN: [id number]  CC: Michael Hardin is a 34 y.o. male who presents today for follow up.   HPI: Feels well today. NO complaints  HTN- compliant with amlodipine. BP at home 130/70. No cp, dizziness, SOB.   Describes that HA is improving with addition of amlodipine. Prior had migraine 1 per week. Has only had one migraine in the past 2 months.  Denies aura, vision changes.  When has migraine has photophobia. Feels that possible OSA is contributory. HA is not positional or exacerbated by valsalva.  Last migraine 2 months ago, he woke up with ha and was dreaming about having a ha, feels related to apneic episode. HA feels similar to HA in the past.   H/o gout in knees, ankles. Started allopurinol 373m ( old rx at home) and joint 'ache' has improved.   Pending sleep study with Dr KMortimer Frieslater this week.      HISTORY:  Past Medical History:  Diagnosis Date  . Gout    in ankle  . Headache 12/30/2019  . Hypertension   . Migraines    Past Surgical History:  Procedure Laterality Date  . ARTHOSCOPIC ROTAOR CUFF REPAIR Left    Family History  Problem Relation Age of Onset  . Diabetes Mother   . Sleep apnea Mother   . Hypertension Father        elevated M proteins  . Hypothyroidism Sister   . Diabetes Maternal Grandmother   . Prostate cancer Maternal Grandfather   . Skin cancer Paternal Grandmother   . Multiple myeloma Paternal Grandfather     Allergies: Patient has no known allergies. Current Outpatient Medications on File Prior to Visit  Medication Sig Dispense Refill  . amLODipine (NORVASC) 2.5 MG tablet Take 1 tablet (2.5 mg total) by mouth daily. 30 tablet 2  . Cholecalciferol (VITAMIN D3 PO) Take 1 tablet by mouth daily.     No current facility-administered medications on file prior to visit.    Social History   Tobacco Use  . Smoking status: Never Smoker  . Smokeless tobacco: Never Used    Substance Use Topics  . Alcohol use: Not Currently  . Drug use: Not Currently    Review of Systems  Constitutional: Negative for chills and fever.  Eyes: Negative for photophobia and visual disturbance.  Respiratory: Negative for cough and shortness of breath.   Cardiovascular: Negative for chest pain, palpitations and leg swelling.  Gastrointestinal: Negative for nausea and vomiting.  Musculoskeletal: Positive for arthralgias (improved).  Neurological: Positive for headaches (improved). Negative for dizziness.      Objective:    BP 118/82   Pulse 70   Temp (!) 96.9 F (36.1 C) (Temporal)   Ht 5' 6"  (1.676 m)   Wt (!) 311 lb 3.2 oz (141.2 kg)   SpO2 99%   BMI 50.23 kg/m  BP Readings from Last 3 Encounters:  03/01/20 118/82  02/11/20 132/84  01/18/20 100/70   Wt Readings from Last 3 Encounters:  03/01/20 (!) 311 lb 3.2 oz (141.2 kg)  02/11/20 (!) 311 lb 9.6 oz (141.3 kg)  01/18/20 (!) 306 lb 12.8 oz (139.2 kg)    Physical Exam Vitals reviewed.  Constitutional:      Appearance: He is well-developed.  Cardiovascular:     Rate and Rhythm: Regular rhythm.     Heart sounds: Normal heart sounds.  Pulmonary:  Effort: Pulmonary effort is normal. No respiratory distress.     Breath sounds: Normal breath sounds. No wheezing, rhonchi or rales.  Skin:    General: Skin is warm and dry.  Neurological:     Mental Status: He is alert.  Psychiatric:        Speech: Speech normal.        Behavior: Behavior normal.        Assessment & Plan:   Problem List Items Addressed This Visit      Cardiovascular and Mediastinum   HTN (hypertension)    At goal. Continue amlodipine.         Other   Gout - Primary    Joint pain has improved. Start 162m allopurinol ( advised to stop 3049mat home) and he will check uric acid in a few weeks.       Relevant Medications   allopurinol (ZYLOPRIM) 100 MG tablet   Headache    Improved. Agree that undiagnosed OSA may be  contributory. Patient declines any neuroimaging at this time as he had CT, MRI Brain in the past per patient. He would like to await possible treatment of OSA. Close follow up.       Relevant Medications   allopurinol (ZYLOPRIM) 100 MG tablet       I have discontinued Haneef R. Dudash's allopurinol. I am also having him start on allopurinol. Additionally, I am having him maintain his amLODipine and Cholecalciferol (VITAMIN D3 PO).   Meds ordered this encounter  Medications  . allopurinol (ZYLOPRIM) 100 MG tablet    Sig: Take 1 tablet (100 mg total) by mouth daily.    Dispense:  30 tablet    Refill:  6    Order Specific Question:   Supervising Provider    Answer:   TUCrecencio [id number]]    Return precautions given.   Risks, benefits, and alternatives of the medications and treatment plan prescribed today were discussed, and patient expressed understanding.   Education regarding symptom management and diagnosis given to patient on AVS.  Continue to follow with ArBurnard HawthorneFNP for routine health maintenance.   CaRacheal Patchesoles and I agreed with plan.   MaMable ParisFNP

## 2020-03-03 ENCOUNTER — Ambulatory Visit: Payer: 59

## 2020-03-03 ENCOUNTER — Other Ambulatory Visit: Payer: Self-pay

## 2020-03-03 DIAGNOSIS — G4719 Other hypersomnia: Secondary | ICD-10-CM

## 2020-03-03 DIAGNOSIS — G4733 Obstructive sleep apnea (adult) (pediatric): Secondary | ICD-10-CM | POA: Diagnosis not present

## 2020-03-04 ENCOUNTER — Telehealth: Payer: Self-pay | Admitting: Pulmonary Disease

## 2020-03-04 DIAGNOSIS — G4733 Obstructive sleep apnea (adult) (pediatric): Secondary | ICD-10-CM

## 2020-03-04 NOTE — Telephone Encounter (Signed)
Home sleep study 03/03/20 >> AHI 32.7, SpO2 low 58%.  Severe obstructive sleep apnea.  Will route to Dr. Belia Heman to f/u with patient.

## 2020-03-08 ENCOUNTER — Ambulatory Visit: Payer: 59 | Attending: Internal Medicine

## 2020-03-08 DIAGNOSIS — Z23 Encounter for immunization: Secondary | ICD-10-CM

## 2020-03-08 NOTE — Progress Notes (Signed)
   Covid-19 Vaccination Clinic  Name:  Michael Hardin    MRN: [id number] DOB: [birth year]  03/08/2020  Michael Hardin was observed post Covid-19 immunization for 15 minutes without incident. He was provided with Vaccine Information Sheet and instruction to access the V-Safe system.   Michael Hardin was instructed to call 911 with any severe reactions post vaccine: Marland Kitchen Difficulty breathing  . Swelling of face and throat  . A fast heartbeat  . A bad rash all over body  . Dizziness and weakness   Immunizations Administered    Name Date Dose VIS Date Route   Pfizer COVID-19 Vaccine 03/08/2020  8:20 AM 0.3 mL 11/06/2019 Intramuscular   Manufacturer: [employer], [employer]   Lot: [id number]   NDC: [address]-4

## 2020-03-17 DIAGNOSIS — G4733 Obstructive sleep apnea (adult) (pediatric): Secondary | ICD-10-CM

## 2020-03-17 NOTE — Telephone Encounter (Signed)
Dr. Belia Heman, Please advise on patients mychart message:  Dr. Belia Heman,   I hope that you are doing well. It has been two weeks since my sleep study and I still haven't heard back from your office. I was told by your team to follow-up at the two week mark. Would you please look into that at your convenience?   Blessings,   [employer]

## 2020-03-18 NOTE — Telephone Encounter (Signed)
Called and spoke with patient about recs from Dr. Belia Heman. Patient agreed to get started on CPAP. Order has been placed. Nothing further needed at this time

## 2020-04-03 ENCOUNTER — Other Ambulatory Visit: Payer: Self-pay | Admitting: Family

## 2020-04-12 ENCOUNTER — Other Ambulatory Visit: Payer: Self-pay | Admitting: Family

## 2020-04-12 ENCOUNTER — Other Ambulatory Visit: Payer: Self-pay

## 2020-04-12 ENCOUNTER — Other Ambulatory Visit (INDEPENDENT_AMBULATORY_CARE_PROVIDER_SITE_OTHER): Payer: 59

## 2020-04-12 DIAGNOSIS — E790 Hyperuricemia without signs of inflammatory arthritis and tophaceous disease: Secondary | ICD-10-CM

## 2020-04-12 LAB — URIC ACID: Uric Acid, Serum: 8.3 mg/dL — ABNORMAL HIGH (ref 4.0–7.8)

## 2020-04-12 MED ORDER — ALLOPURINOL 100 MG PO TABS: 200.0000 mg | ORAL_TABLET | Freq: Every day | ORAL | 1 refills | Status: DC

## 2020-05-31 ENCOUNTER — Ambulatory Visit: Payer: 59 | Admitting: Family

## 2020-06-02 ENCOUNTER — Encounter: Payer: Self-pay | Admitting: Family

## 2020-06-03 ENCOUNTER — Other Ambulatory Visit: Payer: Self-pay | Admitting: Family

## 2020-06-03 DIAGNOSIS — E790 Hyperuricemia without signs of inflammatory arthritis and tophaceous disease: Secondary | ICD-10-CM

## 2020-06-03 MED ORDER — ALLOPURINOL 100 MG PO TABS: 200.0000 mg | ORAL_TABLET | Freq: Every day | ORAL | 1 refills | Status: DC

## 2020-06-24 ENCOUNTER — Telehealth: Payer: Self-pay

## 2020-06-24 NOTE — Telephone Encounter (Signed)
Pt has been scheduled for phone visit with Michael Manis, NP on 06/29/2020 at 330. Okay per [facility] via secure message.  Pt is aware of appt date/time and voiced his understanding.  Nothing further is needed at this time.

## 2020-06-24 NOTE — Telephone Encounter (Signed)
cpap complaince review by Dr. Belia Heman- continue with auto cpap as prescribed.  Pt has been scheduled for f/u with Elisha Headland on 08/09/2020, however pt has been on cpap 89 days.  Message has been sent to Buelah Manis to see if pt can be scheduled for phone visit at [facility] office on 06/29/2020. Will await response.

## 2020-06-29 ENCOUNTER — Ambulatory Visit (INDEPENDENT_AMBULATORY_CARE_PROVIDER_SITE_OTHER): Payer: 59 | Admitting: Primary Care

## 2020-06-29 ENCOUNTER — Other Ambulatory Visit: Payer: Self-pay

## 2020-06-29 ENCOUNTER — Encounter: Payer: Self-pay | Admitting: Primary Care

## 2020-06-29 DIAGNOSIS — G4733 Obstructive sleep apnea (adult) (pediatric): Secondary | ICD-10-CM

## 2020-06-29 DIAGNOSIS — Z9989 Dependence on other enabling machines and devices: Secondary | ICD-10-CM

## 2020-06-29 NOTE — Progress Notes (Signed)
Virtual Visit via Telephone Note  I connected with Trayvond Viets Glauser on 06/29/20 at  3:30 PM EDT by telephone and verified that I am speaking with the correct person using two identifiers.  Location: Patient: Home (car phone) Provider: Office    I discussed the limitations, risks, security and privacy concerns of performing an evaluation and management service by telephone and the availability of in person appointments. I also discussed with the patient that there may be a patient responsible charge related to this service. The patient expressed understanding and agreed to proceed.   History of Present Illness: 34 year old male. PMH significant for sleep apnea, obesity. Patient of Dr. Belia Heman, seen for initial consult on 02/11/20 for excessive daytime sleepiness. HST in April 2021 showed severe obstructive sleep apnea, AHI 32.7. Started on CPAP auto titrate 5-15cm h20. DME company [employer].   06/29/2020 Patient contacted today for 4-5 month follow-up for sleep apnea. He states that everything is going well. He is having no issues with CPAP mask or pressure setting. He is sleeping well at night with no day time fatigue. Continue to encourage weight loss and increasing physical activity .   Observations/Objective:  - Appears well, able to speak in full sentences with no overt shortness of breath  Assessment and Plan:  OSA: - HST in April 2021 showed severe obstructive sleep apnea, AHI 32.7 - Patient reports compliance and benefit from CPAP therapy - No issues with mask fit or pressure setting - Continue auto pressure 5-15cm h20.  - No changes - Advised patient not to drive if experiencing excessive daytime or somnolence  - Requesting download for review   Follow Up Instructions:  - 6 months with Dr. Belia Heman    I discussed the assessment and treatment plan with the patient. The patient was provided an opportunity to ask questions and all were answered. The patient agreed with the plan and  demonstrated an understanding of the instructions.   The patient was advised to call back or seek an in-person evaluation if the symptoms worsen or if the condition fails to improve as anticipated.  I provided  18 minutes of non-face-to-face time during this encounter.   Glenford Bayley, NP

## 2020-07-10 ENCOUNTER — Other Ambulatory Visit: Payer: Self-pay | Admitting: Family

## 2020-07-18 ENCOUNTER — Encounter: Payer: Self-pay | Admitting: Family

## 2020-07-18 ENCOUNTER — Telehealth (INDEPENDENT_AMBULATORY_CARE_PROVIDER_SITE_OTHER): Payer: 59 | Admitting: Family

## 2020-07-18 VITALS — Ht 66.0 in | Wt 311.0 lb

## 2020-07-18 DIAGNOSIS — G4730 Sleep apnea, unspecified: Secondary | ICD-10-CM

## 2020-07-18 DIAGNOSIS — M109 Gout, unspecified: Secondary | ICD-10-CM | POA: Diagnosis not present

## 2020-07-18 DIAGNOSIS — I10 Essential (primary) hypertension: Secondary | ICD-10-CM | POA: Diagnosis not present

## 2020-07-18 DIAGNOSIS — R519 Headache, unspecified: Secondary | ICD-10-CM | POA: Diagnosis not present

## 2020-07-18 NOTE — Assessment & Plan Note (Signed)
Stable, continue allopurinol.  Pending uric acid

## 2020-07-18 NOTE — Progress Notes (Signed)
Virtual Visit via Video Note  I connected with@  on 07/18/20 at  8:00 AM EDT by a video enabled telemedicine application and verified that I am speaking with the correct person using two identifiers.  Location patient: home Location provider:work  Persons participating in the virtual visit: patient, provider  I discussed the limitations of evaluation and management by telemedicine and the availability of in person appointments. The patient expressed understanding and agreed to proceed.   HPI: Feels well today No complaints  HTN- compliant with amlodipine. hasnt checked blood pressure in some time. No cp.  Joint pain- resolve on allopurinol 215m.   OSA- compliant with cipap machine. HA and fatigue has improved. Following with pulmonology     ROS: See pertinent positives and negatives per HPI.  Past Medical History:  Diagnosis Date  . Gout    in ankle  . Headache 12/30/2019  . Hypertension   . Migraines     Past Surgical History:  Procedure Laterality Date  . ARTHOSCOPIC ROTAOR CUFF REPAIR Left     Family History  Problem Relation Age of Onset  . Diabetes Mother   . Sleep apnea Mother   . Hypertension Father        elevated M proteins  . Hypothyroidism Sister   . Diabetes Maternal Grandmother   . Prostate cancer Maternal Grandfather   . Skin cancer Paternal Grandmother   . Multiple myeloma Paternal Grandfather       Current Outpatient Medications:  .  allopurinol (ZYLOPRIM) 100 MG tablet, Take 2 tablets (200 mg total) by mouth daily., Disp: 120 tablet, Rfl: 1 .  amLODipine (NORVASC) 2.5 MG tablet, TAKE 1 TABLET BY MOUTH EVERY DAY, Disp: 30 tablet, Rfl: 2 .  Cholecalciferol (VITAMIN D3 PO), Take 1 tablet by mouth daily., Disp: , Rfl:   EXAM:  VITALS per patient if applicable: BP Readings from Last 3 Encounters:  03/01/20 118/82  02/11/20 132/84  01/18/20 100/70     GENERAL: alert, oriented, appears well and in no acute distress  HEENT: atraumatic,  conjunttiva clear, no obvious abnormalities on inspection of external nose and ears  NECK: normal movements of the head and neck  LUNGS: on inspection no signs of respiratory distress, breathing rate appears normal, no obvious gross SOB, gasping or wheezing  CV: no obvious cyanosis  MS: moves all visible extremities without noticeable abnormality  PSYCH/NEURO: pleasant and cooperative, no obvious depression or anxiety, speech and thought processing grossly intact  ASSESSMENT AND PLAN:  Discussed the following assessment and plan:  Essential hypertension  Gout, unspecified cause, unspecified chronicity, unspecified site - Plan: Uric acid  Nonintractable headache, unspecified chronicity pattern, unspecified headache type  Sleep apnea, unspecified type Problem List Items Addressed This Visit      Cardiovascular and Mediastinum   HTN (hypertension) - Primary    Stable, continue amlodipine        Respiratory   Sleep apnea    Compliant with cipap. Fatigue, HA improved on cipap. Will follow        Other   Gout    Stable, continue allopurinol.  Pending uric acid      Relevant Orders   Uric acid   Headache    Resolved on cipap.         -we discussed possible serious and likely etiologies, options for evaluation and workup, limitations of telemedicine visit vs in person visit, treatment, treatment risks and precautions. Pt prefers to treat via telemedicine empirically rather then risking or undertaking  an in person visit at this moment.  Work/School slipped offered: Advised to seek prompt follow up telemedicine visit or in person care if worsening, new symptoms arise, or if is not improving with treatment. Did let her know that I only do telemedicine on Tuesdays and Thursdays for [place] and advised follow up visit with PCP or [facility] if needs follow up or if any further questions arise to avoid any delays.   I discussed the assessment and treatment plan with the patient. The  patient was provided an opportunity to ask questions and all were answered. The patient agreed with the plan and demonstrated an understanding of the instructions.   The patient was advised to call back or seek an in-person evaluation if the symptoms worsen or if the condition fails to improve as anticipated.   Mable Paris, FNP

## 2020-07-18 NOTE — Assessment & Plan Note (Signed)
Compliant with cipap. Fatigue, HA improved on cipap. Will follow

## 2020-07-18 NOTE — Assessment & Plan Note (Signed)
Resolved on cipap.

## 2020-07-18 NOTE — Assessment & Plan Note (Signed)
Stable, continue amlodipine.  

## 2020-07-22 NOTE — Patient Instructions (Signed)
No changes Continue CPAP at current pressure settings

## 2020-08-09 ENCOUNTER — Ambulatory Visit: Payer: 59 | Admitting: Pulmonary Disease

## 2020-10-25 ENCOUNTER — Other Ambulatory Visit: Payer: Self-pay | Admitting: Family

## 2020-11-15 ENCOUNTER — Encounter: Payer: Self-pay | Admitting: Family

## 2020-11-16 ENCOUNTER — Telehealth (INDEPENDENT_AMBULATORY_CARE_PROVIDER_SITE_OTHER): Payer: 59 | Admitting: Family

## 2020-11-16 ENCOUNTER — Encounter: Payer: Self-pay | Admitting: Family

## 2020-11-16 DIAGNOSIS — U071 COVID-19: Secondary | ICD-10-CM | POA: Diagnosis not present

## 2020-11-16 NOTE — Telephone Encounter (Signed)
Pt called and scheduled in our 12p slot. I have referred him for MAI prior to appointment.

## 2020-11-16 NOTE — Patient Instructions (Addendum)
Covid testing in [facility]. I recommend anyone that has had close contact with you to quarantine immediately and be tested with PCR covid test for most accurate test.    [email or web address]  We have called [facility] in [place] regarding monoclonal antibodies infusion as we discussed;please expect a phone call from them in the next day or  Two, if not please let me know asap as you have narrow window to get this infusion. You have to receive with 10 days of symptom onset.     Purchase pulse oximeter to monitor oxygenation saturation. If you oxygen were to drop < 90%, this is an emergency and you may need oxygen support. You would need to seek in person evaluation at the emergency room or call 911.   Please stop cold and cold flu medications with sudafed.   Please start Mucinex DM to take at bedtime for cough.     Please begin Vit C 1000 mg daily   Vitamin D3 4000 IU daily   Zinc 50 mg daily   Quercetin 250 mg -500 mg twice daily ; this an antioxidant which reduces inflammation. You can find it at places such as [employer] and [employer] however not limited to these stores.   Once you are better, you may STOP all the above supplements.   Rest, hydrate very well, eat healthy protein food, Tylenol or Advil as directed .    Please go to Acute Care if symptoms worsen but are not an emergency. Office video visit again early next week.    Remain in self quarantine until at least 10 days since symptom onset AND 3 consecutive days fever free without antipyretics AND improvement in respiratory symptoms.    Utilize over the counter medications to treat symptoms.   Seek  treatment in the ED if respiratory issues/distress develops or unrelieved chest pain. Or, if you become severely weak, dehydrated, confused.    Only leave home to seek medical care and must wear a mask in public. Limit contact with family members or  caregivers in the home and to notify her family who she was with yesterday that they should quarantine for 14 days. Practice social distancing and to continue to use good preventative care measures such has frequent hand washing.      Your COVID-19 test was resulted as positive.    You should continue your self imposed quantarine until you can answer "yes" to ALL 3 of the conditions below:   1) Your symptoms (fever, cough, shortness of breath) started 7 or more days ago   2) Your body temperature  has been normal for at least 72 hours (WITHOUT the use of any tylenol, motrin or aleve) . Normal is < 100.4 Farenheit  3) your other flu  like symptoms symptoms are getting better.   YOUR FAMILY or other household contacts, however , even if they feel fine , will need to continue to quarantining themselves  (that means no contact with ANYONE outside of the house)  for 14 days STARTING from the end of your initial 7 day period . (why? because they have been theoretically exposed to you during the entire 7 days of your illness, and they can sheD the virus without symptoms for that period of time ).         10 Things You Can Do to Manage Your COVID-19 Symptoms at Home If you have possible or confirmed COVID-19: 1. Stay home from work and school. And stay away from  other public places. If you must go out, avoid using any kind of public transportation, ridesharing, or taxis. 2. Monitor your symptoms carefully. If your symptoms get worse, call your healthcare provider immediately. 3. Get rest and stay hydrated. 4. If you have a medical appointment, call the healthcare provider ahead of time and tell them that you have or may have COVID-19. 5. For medical emergencies, call 911 and notify the dispatch personnel that you have or may have COVID-19. 6. Cover your cough and sneezes with a tissue or use the inside of your elbow. 7. Wash your hands often with soap and water for at least 20 seconds or clean your  hands with an alcohol-based hand sanitizer that contains at least 60% alcohol. 8. As much as possible, stay in a specific room and away from other people in your home. Also, you should use a separate bathroom, if available. If you need to be around other people in or outside of the home, wear a mask. 9. Avoid sharing personal items with other people in your household, like dishes, towels, and bedding. 10. Clean all surfaces that are touched often, like counters, tabletops, and doorknobs. Use household cleaning sprays or wipes according to the label instructions. [email or web address] 05/27/2019

## 2020-11-16 NOTE — Assessment & Plan Note (Addendum)
No acute respiratory distress. BMI 50 and patient qualifies for MAI. We have called and left a message. Counseled on presenting to ED if sa02 < 90% or he develops SOB.  Advised against any OTC cough medications with phenylephrine in setting of HTN. Advised mucinex and vitamin c, vitamin d , zinc, quercetin. He will let us know how he is doing

## 2020-11-16 NOTE — Progress Notes (Signed)
Virtual Visit via Video Note  I connected with@  on 11/16/20 at 12:00 PM EST by a video enabled telemedicine application and verified that I am speaking with the correct person using two identifiers.  Location patient: home Location provider:work  Persons participating in the virtual visit: patient, provider  I discussed the limitations of evaluation and management by telemedicine and the availability of in person appointments. The patient expressed understanding and agreed to proceed.   HPI: Acute visit Covid positive Awaiting on covid PCR test from yesterday; home covid tesst positive.  Nasal congestion started yesterday.  Fever started last night, 100.65F.Endorses dry cough.  Sa02 94%. Noted while laying supine with cipap, sa02 got as low as 90%. No Sob with activity, wheezing.  Covid vaccinated. Taking tylenol and ibuprofen, alternating. Uses OTC cough medication with  Drinking a lot of water. Decrease of appetite.  Daughter started to cough yesterday At home covid test positive.  Never smoker  H/o OSA   ROS: See pertinent positives and negatives per HPI.    EXAM:  VITALS per patient if applicable: Ht 5\' 6"  (1.676 m)   Wt (!) 311 lb (141.1 kg)   BMI 50.20 kg/m  BP Readings from Last 3 Encounters:  03/01/20 118/82  02/11/20 132/84  01/18/20 100/70   Wt Readings from Last 3 Encounters:  11/16/20 (!) 311 lb (141.1 kg)  07/18/20 (!) 311 lb (141.1 kg)  03/01/20 (!) 311 lb 3.2 oz (141.2 kg)    GENERAL: alert, oriented, appears well and in no acute distress  HEENT: atraumatic, conjunttiva clear, no obvious abnormalities on inspection of external nose and ears  NECK: normal movements of the head and neck  LUNGS: on inspection no signs of respiratory distress, breathing rate appears normal, no obvious gross SOB, gasping or wheezing  CV: no obvious cyanosis  MS: moves all visible extremities without noticeable abnormality  PSYCH/NEURO: pleasant and cooperative,  no obvious depression or anxiety, speech and thought processing grossly intact  ASSESSMENT AND PLAN:  Discussed the following assessment and plan:  Problem List Items Addressed This Visit      Other   COVID-19    No acute respiratory distress. BMI 50 and patient qualifies for MAI. We have called and left a message. Counseled on presenting to ED if sa02 < 90% or he develops SOB.  Advised against any OTC cough medications with phenylephrine in setting of HTN. Advised mucinex and vitamin c, vitamin d , zinc, quercetin. He will let 05/01/20 know how he is doing         -we discussed possible serious and likely etiologies, options for evaluation and workup, limitations of telemedicine visit vs in person visit, treatment, treatment risks and precautions. Pt prefers to treat via telemedicine empirically rather then risking or undertaking an in person visit at this moment.  .   I discussed the assessment and treatment plan with the patient. The patient was provided an opportunity to ask questions and all were answered. The patient agreed with the plan and demonstrated an understanding of the instructions.   The patient was advised to call back or seek an in-person evaluation if the symptoms worsen or if the condition fails to improve as anticipated.   [place], FNP

## 2020-11-20 ENCOUNTER — Encounter: Payer: Self-pay | Admitting: Family

## 2020-11-21 ENCOUNTER — Ambulatory Visit
Admission: EM | Admit: 2020-11-21 | Discharge: 2020-11-21 | Disposition: A | Discharge status: Home / Self Care | Payer: 59 | Attending: Family Medicine | Admitting: Family Medicine

## 2020-11-21 ENCOUNTER — Ambulatory Visit (HOSPITAL_COMMUNITY)
Admission: RE | Admit: 2020-11-21 | Discharge: 2020-11-21 | Disposition: A | Discharge status: Home / Self Care | Payer: 59 | Source: Ambulatory Visit | Attending: Pulmonary Disease | Admitting: Pulmonary Disease

## 2020-11-21 ENCOUNTER — Encounter: Payer: Self-pay | Admitting: Family Medicine

## 2020-11-21 ENCOUNTER — Other Ambulatory Visit (HOSPITAL_COMMUNITY): Payer: Self-pay | Admitting: Nurse Practitioner

## 2020-11-21 ENCOUNTER — Ambulatory Visit: Payer: 59

## 2020-11-21 ENCOUNTER — Ambulatory Visit (INDEPENDENT_AMBULATORY_CARE_PROVIDER_SITE_OTHER): Payer: 59

## 2020-11-21 ENCOUNTER — Encounter: Payer: Self-pay | Admitting: Family

## 2020-11-21 ENCOUNTER — Other Ambulatory Visit: Payer: Self-pay

## 2020-11-21 DIAGNOSIS — R509 Fever, unspecified: Secondary | ICD-10-CM

## 2020-11-21 DIAGNOSIS — U071 COVID-19: Secondary | ICD-10-CM

## 2020-11-21 DIAGNOSIS — R112 Nausea with vomiting, unspecified: Secondary | ICD-10-CM

## 2020-11-21 DIAGNOSIS — J209 Acute bronchitis, unspecified: Secondary | ICD-10-CM

## 2020-11-21 DIAGNOSIS — J1282 Pneumonia due to coronavirus disease 2019: Secondary | ICD-10-CM

## 2020-11-21 MED ORDER — ONDANSETRON 4 MG PO TBDP: 4.0000 mg | ORAL_TABLET | Freq: Three times a day (TID) | ORAL | 0 refills | Status: DC | PRN

## 2020-11-21 MED ORDER — SODIUM CHLORIDE 0.9 % IV SOLN: Freq: Once | INTRAVENOUS | Status: AC

## 2020-11-21 MED ORDER — EPINEPHRINE 0.3 MG/0.3ML IJ SOAJ: 0.3000 mg | Freq: Once | INTRAMUSCULAR | Status: DC | PRN

## 2020-11-21 MED ORDER — DIPHENHYDRAMINE HCL 50 MG/ML IJ SOLN: 50.0000 mg | Freq: Once | INTRAMUSCULAR | Status: DC | PRN

## 2020-11-21 MED ORDER — SODIUM CHLORIDE 0.9 % IV SOLN: INTRAVENOUS | Status: DC | PRN

## 2020-11-21 MED ORDER — ONDANSETRON 4 MG PO TBDP
4.0000 mg | ORAL_TABLET | Freq: Once | ORAL | Status: AC
  Administered 2020-11-21: 11:00:00 4 mg via ORAL

## 2020-11-21 MED ORDER — ALBUTEROL SULFATE HFA 108 (90 BASE) MCG/ACT IN AERS: 2.0000 | INHALATION_SPRAY | Freq: Once | RESPIRATORY_TRACT | Status: DC | PRN

## 2020-11-21 MED ORDER — FAMOTIDINE IN NACL 20-0.9 MG/50ML-% IV SOLN: 20.0000 mg | Freq: Once | INTRAVENOUS | Status: DC | PRN

## 2020-11-21 MED ORDER — BENZONATATE 100 MG PO CAPS
100.0000 mg | ORAL_CAPSULE | Freq: Three times a day (TID) | ORAL | 0 refills | Status: DC
Start: 2020-11-21 — End: 2021-11-09

## 2020-11-21 MED ORDER — METHYLPREDNISOLONE SODIUM SUCC 125 MG IJ SOLR: 125.0000 mg | Freq: Once | INTRAMUSCULAR | Status: DC | PRN

## 2020-11-21 NOTE — Discharge Instructions (Signed)
10 Things You Can Do to Manage Your COVID-19 Symptoms at Home If you have possible or confirmed COVID-19: 1. Stay home from work and school. And stay away from other public places. If you must go out, avoid using any kind of public transportation, ridesharing, or taxis. 2. Monitor your symptoms carefully. If your symptoms get worse, call your healthcare provider immediately. 3. Get rest and stay hydrated. 4. If you have a medical appointment, call the healthcare provider ahead of time and tell them that you have or may have COVID-19. 5. For medical emergencies, call 911 and notify the dispatch personnel that you have or may have COVID-19. 6. Cover your cough and sneezes with a tissue or use the inside of your elbow. 7. Wash your hands often with soap and water for at least 20 seconds or clean your hands with an alcohol-based hand sanitizer that contains at least 60% alcohol. 8. As much as possible, stay in a specific room and away from other people in your home. Also, you should use a separate bathroom, if available. If you need to be around other people in or outside of the home, wear a mask. 9. Avoid sharing personal items with other people in your household, like dishes, towels, and bedding. 10. Clean all surfaces that are touched often, like counters, tabletops, and doorknobs. Use household cleaning sprays or wipes according to the label instructions. cdc.gov/coronavirus 05/27/2019 This information is not intended to replace advice given to you by your health care provider. Make sure you discuss any questions you have with your health care provider. Document Revised: 10/29/2019 Document Reviewed: 10/29/2019 Elsevier Patient Education  2020 [employer] Inc. What types of side effects do monoclonal antibody drugs cause?  Common side effects  In general, the more common side effects caused by monoclonal antibody drugs include: . Allergic reactions, such as hives or itching . Flu-like signs and  symptoms, including chills, fatigue, fever, and muscle aches and pains . Nausea, vomiting . Diarrhea . Skin rashes . Low blood pressure   The CDC is recommending patients who receive monoclonal antibody treatments wait at least 90 days before being vaccinated.  Currently, there are no data on the safety and efficacy of mRNA COVID-19 vaccines in persons who received monoclonal antibodies or convalescent plasma as part of COVID-19 treatment. Based on the estimated half-life of such therapies as well as evidence suggesting that reinfection is uncommon in the 90 days after initial infection, vaccination should be deferred for at least 90 days, as a precautionary measure until additional information becomes available, to avoid interference of the antibody treatment with vaccine-induced immune responses. If you have any questions or concerns after the infusion please call the Advanced Practice Provider on call at [phone]. This number is ONLY intended for your use regarding questions or concerns about the infusion post-treatment side-effects.  Please do not provide this number to others for use. For return to work notes please contact your primary care provider.   If someone you know is interested in receiving treatment please have them call the COVID hotline at [phone].   

## 2020-11-21 NOTE — ED Provider Notes (Signed)
Michael Hardin    CSN: [id number] Arrival date & time: 11/21/20  0907      History   Chief Complaint No chief complaint on file.   HPI Michael Hardin is a 34 y.o. male.   Patient is a 34 year old male past medical history of gout, headache, hypertension, migraines, sleep apnea, obesity.  He presents today with persistent fevers, cough, dizziness, nausea, mild shortness of breath.  This is been present for a week.  Tested positive for Covid a week ago.  Fever staying between 100-102.5 even with medication.  Last dose of ibuprofen was last night.  Taking ibuprofen, Tylenol, zinc and vitamins.  Reports his oxygen has been around 95% at home.     Past Medical History:  Diagnosis Date  . Gout    in ankle  . Headache 12/30/2019  . Hypertension   . Migraines     Patient Active Problem List   Diagnosis Date Noted  . COVID-19 11/16/2020  . Gout 03/01/2020  . HTN (hypertension) 01/18/2020  . Sebaceous cyst 01/05/2020  . Umbilical hernia 94/49/6759  . Sleep apnea 12/30/2019  . Skin mass 12/30/2019  . Family history of multiple myeloma 12/30/2019  . Encounter to establish care 12/30/2019  . Headache 12/30/2019    Past Surgical History:  Procedure Laterality Date  . ARTHOSCOPIC ROTAOR CUFF REPAIR Left        Home Medications    Prior to Admission medications   Medication Sig Start Date End Date Taking? Authorizing Provider  allopurinol (ZYLOPRIM) 100 MG tablet Take 2 tablets (200 mg total) by mouth daily. 06/03/20  Yes Burnard Hawthorne, FNP  amLODipine (NORVASC) 2.5 MG tablet TAKE 1 TABLET BY MOUTH EVERY DAY 10/25/20  Yes Arnett, Yvetta Coder, FNP  benzonatate (TESSALON) 100 MG capsule Take 1 capsule (100 mg total) by mouth every 8 (eight) hours. 11/21/20  Yes Modell Fendrick A, NP  Cholecalciferol (VITAMIN D3 PO) Take 1 tablet by mouth daily.   Yes [provider]  ondansetron (ZOFRAN ODT) 4 MG disintegrating tablet Take 1 tablet (4 mg total) by mouth every 8  (eight) hours as needed for nausea or vomiting. 11/21/20  Yes Orvan July, NP    Family History Family History  Problem Relation Age of Onset  . Diabetes Mother   . Sleep apnea Mother   . Hypertension Father        elevated M proteins  . Hypothyroidism Sister   . Diabetes Maternal Grandmother   . Prostate cancer Maternal Grandfather   . Skin cancer Paternal Grandmother   . Multiple myeloma Paternal Grandfather     Social History Social History   Tobacco Use  . Smoking status: Never Smoker  . Smokeless tobacco: Never Used  Vaping Use  . Vaping Use: Never used  Substance Use Topics  . Alcohol use: Not Currently  . Drug use: Not Currently     Allergies   Patient has no known allergies.   Review of Systems Review of Systems   Physical Exam Triage Vital Signs ED Triage Vitals  Enc Vitals Group     BP 11/21/20 0958 132/85     Pulse Rate 11/21/20 0958 97     Resp 11/21/20 0958 18     Temp 11/21/20 0958 99 F (37.2 C)     Temp Source 11/21/20 0958 Oral     SpO2 11/21/20 0958 95 %     Weight --      Height --  Head Circumference --      Peak Flow --      Pain Score 11/21/20 1006 2     Pain Loc --      Pain Edu? --      Excl. in [place]? --    No data found.  Updated Vital Signs BP 132/85 (BP Location: Left Arm)   Pulse 97   Temp 99 F (37.2 C) (Oral)   Resp 18   SpO2 95%   Visual Acuity Right Eye Distance:   Left Eye Distance:   Bilateral Distance:    Right Eye Near:   Left Eye Near:    Bilateral Near:     Physical Exam Vitals and nursing note reviewed.  Constitutional:      General: He is not in acute distress.    Appearance: Normal appearance. He is obese. He is not ill-appearing, toxic-appearing or diaphoretic.  HENT:     Head: Normocephalic and atraumatic.     Right Ear: Tympanic membrane and ear canal normal.     Left Ear: Tympanic membrane and ear canal normal.     Nose: Nose normal.     Mouth/Throat:     Pharynx: Oropharynx is  clear.  Eyes:     Conjunctiva/sclera: Conjunctivae normal.  Cardiovascular:     Rate and Rhythm: Normal rate and regular rhythm.  Pulmonary:     Effort: Pulmonary effort is normal.     Comments: Decreased in the bases   Musculoskeletal:        General: Normal range of motion.     Cervical back: Normal range of motion.  Skin:    General: Skin is warm and dry.  Neurological:     Mental Status: He is alert.  Psychiatric:        Mood and Affect: Mood normal.      UC Treatments / Results  Labs (all labs ordered are listed, but only abnormal results are displayed) Labs Reviewed - No data to display  EKG   Radiology No results found.  Procedures Procedures (including critical care time)  Medications Ordered in UC Medications  ondansetron (ZOFRAN-ODT) disintegrating tablet 4 mg (4 mg Oral Given 11/21/20 1109)    Initial Impression / Assessment and Plan / UC Course  I have reviewed the triage vital signs and the nursing notes.  Pertinent labs & imaging results that were available during my care of the patient were reviewed by me and considered in my medical decision making (see chart for details).       COVID-19 positive test (U07.1, COVID-19) with Acute Pneumonia (J12.89, Other viral pneumonia) (If respiratory failure or sepsis present, add as separate assessment)  Patient with normal vital signs today.  Oxygen saturations 95%.  No concern for hypoxia at this time. Will prescribe nausea medication to use as needed. Tessalon Perles for cough as needed. Recommended continue to monitor her oxygen levels.  We will attempt to get patient into the antibody infusion clinic. ER for worsening symptoms   Final Clinical Impressions(s) / UC Diagnoses   Final diagnoses:  COVID-19  Fever, unspecified  Non-intractable vomiting with nausea, unspecified vomiting type     Discharge Instructions     Your x-ray showed Covid pneumonia.  This is most likely the cause of your  lingering symptoms. Zofran for nausea, vomiting as needed.  Tessalon Perles for cough as needed.  If your symptoms worsen to include shortness of breath you will need to go to the ER.  Keep monitoring  your oxygen levels at home     ED Prescriptions    Medication Sig Dispense Auth. Provider   ondansetron (ZOFRAN ODT) 4 MG disintegrating tablet Take 1 tablet (4 mg total) by mouth every 8 (eight) hours as needed for nausea or vomiting. 20 tablet Joeanthony Seeling A, NP   benzonatate (TESSALON) 100 MG capsule Take 1 capsule (100 mg total) by mouth every 8 (eight) hours. 21 capsule Charlotte Fidalgo A, NP     PDMP not reviewed this encounter.   Loura Halt A, NP 11/21/20 1150

## 2020-11-21 NOTE — Progress Notes (Signed)
I connected by phone with Michael Hardin on 11/21/2020 at 12:13 PM to discuss the potential use of a treatment for mild to moderate COVID-19 viral infection in non-hospitalized patients.  This patient is a 34 y.o. male that meets the FDA criteria for Emergency Use Authorization of bamlanivimab/etesevimab, casirivimab\imdevimab, or sotrovimab  Has a (+) direct SARS-CoV-2 viral test result  Has mild or moderate COVID-19   Is ? 34 years of age and weighs ? 40 kg  Is NOT hospitalized due to COVID-19  Is NOT requiring oxygen therapy or requiring an increase in baseline oxygen flow rate due to COVID-19  Is within 10 days of symptom onset  Has at least one of the high risk factor(s) for progression to severe COVID-19 and/or hospitalization as defined in EUA.  Specific high risk criteria : BMI > 25 and Cardiovascular disease or hypertension   Today is 7th day of symptoms.    I have spoken and communicated the following to the patient or parent/caregiver:  1. FDA has authorized the emergency use of bamlanivimab/etesevimab, casirivimab\imdevimab, or sotrovimab for the treatment of mild to moderate COVID-19 in adults and pediatric patients with positive results of direct SARS-CoV-2 viral testing who are 53 years of age and older weighing at least 40 kg, and who are at high risk for progressing to severe COVID-19 and/or hospitalization.  2. The significant known and potential risks and benefits of bamlanivimab/etesevimab, casirivimab\imdevimab, or sotrovimab, and the extent to which such potential risks and benefits are unknown.  3. Information on available alternative treatments and the risks and benefits of those alternatives, including clinical trials.  4. Patients treated with bamlanivimab/etesevimab, casirivimab\imdevimab, or sotrovimab should continue to self-isolate and use infection control measures (e.g., wear mask, isolate, social distance, avoid sharing personal items, clean and  disinfect "high touch" surfaces, and frequent handwashing) according to CDC guidelines.   5. The patient or parent/caregiver has the option to accept or refuse bamlanivimab/etesevimab, casirivimab\imdevimab, or sotrovimab.  After reviewing this information with the patient, the patient has agreed to receive one of the available covid 19 monoclonal antibodies and will be provided an appropriate fact sheet prior to infusion.Consuello Masse, DNP, AGNP-C [phone] (Infusion Center Hotline)

## 2020-11-21 NOTE — Telephone Encounter (Signed)
Called and spoke to Michael Hardin. Michael Hardin states that Michael Hardin has been experiencing a 102.5 fever while taking tylenol, Nausea, Dizziness, Chest pain when Breathing in x7days. Pt states that his symptoms have stayed constant since the video visit on 11/16/2020. Due to continuous fever, I recommended Michael Hardin go to the local urgent care to be seen and evaluated. Michael Hardin was agreeable and verbalized understanding. Michael Hardin states that Michael Hardin will go to the Urgent care today for evaluation for his fever.

## 2020-11-21 NOTE — Progress Notes (Signed)
Patient reviewed Fact Sheet for Patients, Parents, and Caregivers for Emergency Use Authorization (EUA) of Casi/Regen for the Treatment of Coronavirus. Patient also reviewed and is agreeable to the estimated cost of treatment. Patient is agreeable to proceed.    

## 2020-11-21 NOTE — Discharge Instructions (Addendum)
Your x-ray showed Covid pneumonia.  This is most likely the cause of your lingering symptoms. Zofran for nausea, vomiting as needed.  Tessalon Perles for cough as needed.  If your symptoms worsen to include shortness of breath you will need to go to the ER.  Keep monitoring your oxygen levels at home

## 2020-11-21 NOTE — Progress Notes (Signed)
  Diagnosis: COVID-19  Physician: Dr. Wright   Procedure: Covid Infusion Clinic Med: casirivimab\imdevimab infusion - Provided patient with casirivimab\imdevimab fact sheet for patients, parents and caregivers prior to infusion.  Complications: No immediate complications noted.  Discharge: Discharged home   Abi Shoults  B Ashante Snelling 11/21/2020   

## 2020-11-21 NOTE — ED Triage Notes (Signed)
COVID+ with s/s starting 1 week.  Continued fever, cough, dizziness, n/v.  Taking ibuprofen, tylenol, zinc and vitamins.  No Rx for any of the s/s. Fever staying 100-102.5, even with meds. Last dose of Ibuprofen last night 1030.

## 2020-11-23 ENCOUNTER — Other Ambulatory Visit: Payer: Self-pay | Admitting: Family

## 2020-11-23 DIAGNOSIS — U071 COVID-19: Secondary | ICD-10-CM

## 2020-11-23 MED ORDER — ALBUTEROL SULFATE HFA 108 (90 BASE) MCG/ACT IN AERS: 2.0000 | INHALATION_SPRAY | Freq: Four times a day (QID) | RESPIRATORY_TRACT | 0 refills | Status: DC | PRN

## 2020-11-28 ENCOUNTER — Encounter: Payer: Self-pay | Admitting: Family

## 2020-11-28 DIAGNOSIS — J0100 Acute maxillary sinusitis, unspecified: Secondary | ICD-10-CM

## 2020-11-28 MED ORDER — AMOXICILLIN-POT CLAVULANATE 875-125 MG PO TABS: 1.0000 | ORAL_TABLET | Freq: Two times a day (BID) | ORAL | 0 refills | Status: AC

## 2020-11-28 NOTE — Telephone Encounter (Signed)
Spoke to patient Fever, tmax 101 yesterday which resolved in the evening. Currently 100F.   Symptoms had been improving and afebrile 5 days ago for a couple of days.  No further SOB. No rattle in chest. SA96-97% and with ambulation 95%.  Cough has greatly improved. Continues to have sinus congestion and sinus pain. No facial swelling or swelling. No ear pain, vision changes, HA.  CXR 11/21/20 -Coarse bilateral pulmonary interstitial opacity compatible with COVID-19 pneumonia in this setting No recent antibiotic. COVID positive 12 days ago. Restarted mucinex today.   Plan:  Suspected secondary bacterial sinus infection.  Start augmentin. Continue mucinex. Ensure to take probiotics while on antibiotics and also for 2 weeks after completion. It is important to re-colonize the gut with good bacteria and also to prevent any diarrheal infections associated with antibiotic use.  Patient will let me know how he is doing and if fever continues Plenty of water, rest  Advised to continue to quarantine until symptoms improved and fever resolved for 3 days

## 2020-12-14 ENCOUNTER — Encounter: Payer: Self-pay | Admitting: Family

## 2020-12-16 ENCOUNTER — Other Ambulatory Visit: Payer: Self-pay | Admitting: Family

## 2020-12-16 DIAGNOSIS — U071 COVID-19: Secondary | ICD-10-CM

## 2021-02-04 ENCOUNTER — Other Ambulatory Visit: Payer: Self-pay | Admitting: Family

## 2021-02-04 DIAGNOSIS — E790 Hyperuricemia without signs of inflammatory arthritis and tophaceous disease: Secondary | ICD-10-CM

## 2021-02-12 ENCOUNTER — Other Ambulatory Visit: Payer: Self-pay | Admitting: Family

## 2021-02-14 ENCOUNTER — Other Ambulatory Visit: Payer: Self-pay | Admitting: Family

## 2021-02-14 DIAGNOSIS — U071 COVID-19: Secondary | ICD-10-CM

## 2021-06-15 ENCOUNTER — Other Ambulatory Visit: Payer: Self-pay | Admitting: Family

## 2021-06-15 DIAGNOSIS — E790 Hyperuricemia without signs of inflammatory arthritis and tophaceous disease: Secondary | ICD-10-CM

## 2021-10-23 ENCOUNTER — Other Ambulatory Visit: Payer: Self-pay | Admitting: Family

## 2021-10-23 DIAGNOSIS — E790 Hyperuricemia without signs of inflammatory arthritis and tophaceous disease: Secondary | ICD-10-CM

## 2021-11-09 ENCOUNTER — Other Ambulatory Visit: Payer: Self-pay

## 2021-11-09 ENCOUNTER — Encounter: Payer: Self-pay | Admitting: Family

## 2021-11-09 ENCOUNTER — Ambulatory Visit (INDEPENDENT_AMBULATORY_CARE_PROVIDER_SITE_OTHER): Payer: 59 | Admitting: Family

## 2021-11-09 VITALS — BP 140/96 | HR 85 | Temp 96.1°F | Ht 66.0 in | Wt 318.4 lb

## 2021-11-09 DIAGNOSIS — M109 Gout, unspecified: Secondary | ICD-10-CM | POA: Diagnosis not present

## 2021-11-09 LAB — SEDIMENTATION RATE: Sed Rate: 8 mm/hr (ref 0–15)

## 2021-11-09 LAB — URIC ACID: Uric Acid, Serum: 7.6 mg/dL (ref 4.0–7.8)

## 2021-11-09 MED ORDER — PREDNISONE 10 MG (21) PO TBPK: ORAL_TABLET | ORAL | 0 refills | Status: DC

## 2021-11-09 NOTE — Telephone Encounter (Signed)
Scheduled with NP in office for 1:15 today.

## 2021-11-09 NOTE — Progress Notes (Signed)
Acute Office Visit  Subjective:    Patient ID: Michael Hardin, male    DOB: [birth year], 35 y.o.   MRN: [id number]  Chief Complaint  Patient presents with   Acute Visit    Gout flare in right great toe. Flared up on Wednesday. Pt stated that it is swollen and painful.     HPI Patient is in today with c/o right great toe pain x 1 days. Has mild swelling and mild redness. Pain is 6/10. Admits to not taking allopurinol as he should but takes it most days. Denies any injury.   Past Medical History:  Diagnosis Date   Gout    in ankle   Headache 12/30/2019   Hypertension    Migraines     Past Surgical History:  Procedure Laterality Date   ARTHOSCOPIC ROTAOR CUFF REPAIR Left     Family History  Problem Relation Age of Onset   Diabetes Mother    Sleep apnea Mother    Hypertension Father        elevated M proteins   Hypothyroidism Sister    Diabetes Maternal Grandmother    Prostate cancer Maternal Grandfather    Skin cancer Paternal Grandmother    Multiple myeloma Paternal Grandfather     Social History   Socioeconomic History   Marital status: Married    Spouse name: Not on file   Number of children: 3   Years of education: Not on file   Highest education level: Not on file  Occupational History   Occupation: Theme park manager   Tobacco Use   Smoking status: Never   Smokeless tobacco: Never  Vaping Use   Vaping Use: Never used  Substance and Sexual Activity   Alcohol use: Not Currently   Drug use: Not Currently   Sexual activity: Yes    Partners: Female  Other Topics Concern   Not on file  Social History Narrative   Recently moved from [place].   [place]   3 girls, 7 and 5, 3.    Social Determinants of Health   Financial Resource Strain: Not on file  Food Insecurity: Not on file  Transportation Needs: Not on file  Physical Activity: Not on file  Stress: Not on file  Social Connections: Not on file  Intimate Partner Violence: Not on file     Outpatient Medications Prior to Visit  Medication Sig Dispense Refill   allopurinol (ZYLOPRIM) 100 MG tablet TAKE 2 TABLETS BY MOUTH EVERY DAY 120 tablet 1   albuterol (VENTOLIN HFA) 108 (90 Base) MCG/ACT inhaler TAKE 2 PUFFS BY MOUTH EVERY 6 HOURS AS NEEDED FOR WHEEZE OR SHORTNESS OF BREATH 8.5 each 1   amLODipine (NORVASC) 2.5 MG tablet TAKE 1 TABLET BY MOUTH EVERY DAY 30 tablet 2   benzonatate (TESSALON) 100 MG capsule Take 1 capsule (100 mg total) by mouth every 8 (eight) hours. 21 capsule 0   Cholecalciferol (VITAMIN D3 PO) Take 1 tablet by mouth daily.     ondansetron (ZOFRAN ODT) 4 MG disintegrating tablet Take 1 tablet (4 mg total) by mouth every 8 (eight) hours as needed for nausea or vomiting. 20 tablet 0   No facility-administered medications prior to visit.    No Known Allergies  Review of Systems  Respiratory: Negative.    Cardiovascular: Negative.   Endocrine: Negative.   Musculoskeletal:  Positive for arthralgias.       Right great toe pain and redness  Skin: Negative.   Neurological: Negative.   Psychiatric/Behavioral: Negative.  All other systems reviewed and are negative.     Objective:    Physical Exam Vitals and nursing note reviewed.  Constitutional:      Appearance: Normal appearance.  Cardiovascular:     Rate and Rhythm: Normal rate and regular rhythm.     Pulses: Normal pulses.     Heart sounds: Normal heart sounds.  Pulmonary:     Effort: Pulmonary effort is normal.     Breath sounds: Normal breath sounds.  Musculoskeletal:     Comments: Right great toe has mild swelling and erythema. Tender at the base. Pain with ROM  Skin:    General: Skin is warm and dry.  Neurological:     General: No focal deficit present.     Mental Status: He is alert and oriented to person, place, and time.   BP (!) 140/96 (BP Location: Left Arm, Patient Position: Sitting, Cuff Size: Large)    Pulse 85    Temp (!) 96.1 F (35.6 C) (Temporal)    Ht 5' 6"   (1.676 m)    Wt (!) 318 lb 6.4 oz (144.4 kg)    SpO2 99%    BMI 51.39 kg/m  Wt Readings from Last 3 Encounters:  11/09/21 (!) 318 lb 6.4 oz (144.4 kg)  11/16/20 (!) 311 lb (141.1 kg)  07/18/20 (!) 311 lb (141.1 kg)    Health Maintenance Due  Topic Date Due   Pneumococcal Vaccine 87-47 Years old (1 - PCV) Never done   HIV Screening  Never done   Hepatitis C Screening  Never done   TETANUS/TDAP  Never done    There are no preventive care reminders to display for this patient.   Lab Results  Component Value Date   TSH 2.17 01/12/2020   Lab Results  Component Value Date   WBC 9.1 01/12/2020   HGB 17.2 (H) 01/12/2020   HCT 49.8 01/12/2020   MCV 89.8 01/12/2020   PLT 210.0 01/12/2020   Lab Results  Component Value Date   NA 139 01/12/2020   K 3.8 01/12/2020   CO2 29 01/12/2020   GLUCOSE 89 01/12/2020   BUN 13 01/12/2020   CREATININE 0.82 01/12/2020   BILITOT 0.8 01/12/2020   ALKPHOS 67 01/12/2020   AST 13 01/12/2020   ALT 30 01/12/2020   PROT 6.9 01/26/2020   ALBUMIN 4.3 01/12/2020   CALCIUM 9.5 01/12/2020   GFR 109.21 01/12/2020   Lab Results  Component Value Date   CHOL 164 01/12/2020   Lab Results  Component Value Date   HDL 37.70 (L) 01/12/2020   Lab Results  Component Value Date   LDLCALC 91 01/12/2020   Lab Results  Component Value Date   TRIG 180.0 (H) 01/12/2020   Lab Results  Component Value Date   CHOLHDL 4 01/12/2020   Lab Results  Component Value Date   HGBA1C 5.2 01/12/2020       Assessment & Plan:   Problem List Items Addressed This Visit   None Visit Diagnoses     Gouty arthritis of right great toe    -  Primary   Relevant Medications   predniSONE (STERAPRED UNI-PAK 21 TAB) 10 MG (21) TBPK tablet   Other Relevant Orders   Uric acid   Sedimentation rate        Meds ordered this encounter  Medications   predniSONE (STERAPRED UNI-PAK 21 TAB) 10 MG (21) TBPK tablet    Sig: As directed    Dispense:  21 tablet  Refill:  0   Plan: Call the office if symptoms worsen or persist. Recheck as scheduled and sooner as needed.   Kennyth Arnold, FNP

## 2021-11-25 ENCOUNTER — Telehealth: Payer: 59 | Admitting: Physician Assistant

## 2021-11-25 ENCOUNTER — Encounter: Payer: Self-pay | Admitting: Physician Assistant

## 2021-11-25 DIAGNOSIS — M25471 Effusion, right ankle: Secondary | ICD-10-CM

## 2021-11-25 DIAGNOSIS — M79671 Pain in right foot: Secondary | ICD-10-CM

## 2021-11-25 NOTE — Progress Notes (Signed)
Virtual Visit Consent   Michael Hardin, you are scheduled for a virtual visit with a [facility] provider today.     Just as with appointments in the office, your consent must be obtained to participate.  Your consent will be active for this visit and any virtual visit you may have with one of our providers in the next 365 days.     If you have a MyChart account, a copy of this consent can be sent to you electronically.  All virtual visits are billed to your insurance company just like a traditional visit in the office.    As this is a virtual visit, video technology does not allow for your provider to perform a traditional examination.  This may limit your provider's ability to fully assess your condition.  If your provider identifies any concerns that need to be evaluated in person or the need to arrange testing (such as labs, EKG, etc.), we will make arrangements to do so.     Although advances in technology are sophisticated, we cannot ensure that it will always work on either your end or our end.  If the connection with a video visit is poor, the visit may have to be switched to a telephone visit.  With either a video or telephone visit, we are not always able to ensure that we have a secure connection.     I need to obtain your verbal consent now.   Are you willing to proceed with your visit today?    Michael Hardin has provided verbal consent on 11/25/2021 for a virtual visit (video or telephone).   Leeanne Rio, [place]   Date: 11/25/2021 11:11 AM   Virtual Visit via Video Note   I, Leeanne Rio, connected with  Michael Hardin  ([id number], Dec 13, 1985) on 11/25/21 at 10:45 AM EST by a video-enabled telemedicine application and verified that I am speaking with the correct person using two identifiers.  Location: Patient: Virtual Visit Location Patient: Home Provider: Virtual Visit Location Provider: Home Office   I discussed the limitations of evaluation and management by  telemedicine and the availability of in person appointments. The patient expressed understanding and agreed to proceed.    History of Present Illness: Michael Hardin is a 35 y.o. who identifies as a male who was assigned male at birth, and is being seen today for significant pain of R heel and ankle starting yesterday now with substantial swelling and inability to bare weight. Denies any known trauma or injury. Was playing soccer the day before with his girls but does not remember any twisting injury. Has history of gout and arthritis with flare a few weeks ago in the great toe for which he was placed on prednisone taper with full resolution. Notes this feels different to him.   HPI: HPI  Problems:  Patient Active Problem List   Diagnosis Date Noted   COVID-19 11/16/2020   Gout 03/01/2020   HTN (hypertension) 01/18/2020   Sebaceous cyst 69/48/5462   Umbilical hernia 70/35/0093   Sleep apnea 12/30/2019   Skin mass 12/30/2019   Family history of multiple myeloma 12/30/2019   Encounter to establish care 12/30/2019   Headache 12/30/2019    Allergies: No Known Allergies Medications:  Current Outpatient Medications:    allopurinol (ZYLOPRIM) 100 MG tablet, TAKE 2 TABLETS BY MOUTH EVERY DAY, Disp: 120 tablet, Rfl: 1  Observations/Objective: Patient is well-developed, well-nourished in no acute distress.  Resting comfortably at home.  Head is normocephalic, atraumatic.  No labored breathing. Speech is clear and coherent with logical content.  Patient is alert and oriented at baseline.  R foot,ankle and heel with significant swelling. Some faint bruising noted of heel. Unable to bare weight.   Assessment and Plan: 1. Right ankle swelling  2. Pain of right heel  Substantial pain and swelling in absence of known injury. Unable to put weight on the area 2/2 pain. Can dorsiflex and plantarflex but with significant pain. Sent to Ortho UC at [facility] as they offer Saturday hours  for full evaluation. His wife is taking him there now.   Follow Up Instructions: I discussed the assessment and treatment plan with the patient. The patient was provided an opportunity to ask questions and all were answered. The patient agreed with the plan and demonstrated an understanding of the instructions.  A copy of instructions were sent to the patient via MyChart unless otherwise noted below.   The patient was advised to call back or seek an in-person evaluation if the symptoms worsen or if the condition fails to improve as anticipated.  Time:  I spent 12 minutes with the patient via telehealth technology discussing the above problems/concerns.    Leeanne Rio, PA-C

## 2021-12-08 ENCOUNTER — Encounter: Payer: Self-pay | Admitting: Family

## 2021-12-08 ENCOUNTER — Ambulatory Visit: Payer: Self-pay

## 2021-12-12 ENCOUNTER — Ambulatory Visit (INDEPENDENT_AMBULATORY_CARE_PROVIDER_SITE_OTHER): Payer: 59 | Admitting: Adult Health

## 2021-12-12 ENCOUNTER — Ambulatory Visit (INDEPENDENT_AMBULATORY_CARE_PROVIDER_SITE_OTHER): Payer: 59

## 2021-12-12 ENCOUNTER — Ambulatory Visit
Admission: RE | Admit: 2021-12-12 | Discharge: 2021-12-12 | Disposition: A | Discharge status: Home / Self Care | Payer: 59 | Source: Ambulatory Visit | Attending: Adult Health | Admitting: Adult Health

## 2021-12-12 ENCOUNTER — Encounter: Payer: Self-pay | Admitting: Adult Health

## 2021-12-12 ENCOUNTER — Other Ambulatory Visit: Payer: Self-pay

## 2021-12-12 VITALS — BP 116/78 | HR 91 | Ht 65.98 in | Wt 305.8 lb

## 2021-12-12 DIAGNOSIS — M7989 Other specified soft tissue disorders: Secondary | ICD-10-CM

## 2021-12-12 DIAGNOSIS — M25471 Effusion, right ankle: Secondary | ICD-10-CM | POA: Diagnosis not present

## 2021-12-12 DIAGNOSIS — M79671 Pain in right foot: Secondary | ICD-10-CM | POA: Diagnosis not present

## 2021-12-12 NOTE — Progress Notes (Signed)
Acute Office Visit  Subjective:    Patient ID: Michael Hardin, male    DOB: December 31, 1985, 36 y.o.   MRN: [id number]  Chief Complaint  Patient presents with   Foot Pain    HPI Patient is in today for pain in right foot. He has a history of plantar fascitis and has been seen at [facility]/31/22 He was seen at [facility] /was told tendonitis and plantar fascitis, he had steroids oral and improved.   He then started hurting on the dorsal of the right foot.  Mild swelling in right foot.reports he has had some erythema of right of foot.right ankle swelling lateral side since January 6 th 2023.  History of gout.   Pain has improved as well as erythema.   Patient  denies any fever, chills, rash, chest pain, shortness of breath, nausea, vomiting, or diarrhea.  Denies dizziness, lightheadedness, pre syncopal or syncopal episodes.    Past Medical History:  Diagnosis Date   Gout    in ankle   Headache 12/30/2019   Hypertension    Migraines     Past Surgical History:  Procedure Laterality Date   ARTHOSCOPIC ROTAOR CUFF REPAIR Left     Family History  Problem Relation Age of Onset   Diabetes Mother    Sleep apnea Mother    Hypertension Father        elevated M proteins   Hypothyroidism Sister    Diabetes Maternal Grandmother    Prostate cancer Maternal Grandfather    Skin cancer Paternal Grandmother    Multiple myeloma Paternal Grandfather     Social History   Socioeconomic History   Marital status: Married    Spouse name: Not on file   Number of children: 3   Years of education: Not on file   Highest education level: Not on file  Occupational History   Occupation: Theme park manager   Tobacco Use   Smoking status: Never   Smokeless tobacco: Never  Vaping Use   Vaping Use: Never used  Substance and Sexual Activity   Alcohol use: Not Currently   Drug use: Not Currently   Sexual activity: Yes    Partners: Female  Other Topics Concern   Not on file  Social  History Narrative   Recently moved from [place].   [place]   3 girls, 7 and 5, 3.    Social Determinants of Health   Financial Resource Strain: Not on file  Food Insecurity: Not on file  Transportation Needs: Not on file  Physical Activity: Not on file  Stress: Not on file  Social Connections: Not on file  Intimate Partner Violence: Not on file    Outpatient Medications Prior to Visit  Medication Sig Dispense Refill   allopurinol (ZYLOPRIM) 100 MG tablet TAKE 2 TABLETS BY MOUTH EVERY DAY 120 tablet 1   No facility-administered medications prior to visit.    No Known Allergies  Review of Systems  Constitutional: Negative.   HENT: Negative.    Respiratory: Negative.    Cardiovascular: Negative.   Gastrointestinal: Negative.   Genitourinary: Negative.   Musculoskeletal:  Positive for arthralgias and joint swelling. Negative for back pain, gait problem, myalgias, neck pain and neck stiffness.  Skin:  Positive for color change.  Neurological: Negative.   Hematological: Negative.   Psychiatric/Behavioral: Negative.        Objective:    Physical Exam Musculoskeletal:     Right foot: Tenderness present.  Neurological:     Gait: Gait  is intact.    General: Appearance:    Obese male in no acute distress  Eyes:    PERRL, conjunctiva/corneas clear, EOM's intact       Lungs:     Clear to auscultation bilaterally, respirations unlabored  Heart:    Normal heart rate. Normal rhythm. No murmurs, rubs, or gallops.    MS:   All extremities are intact.    Neurologic:   Awake, alert, oriented x 3. No apparent focal neurological           defect.    Foot/Ankle Musculoskeletal Exam Gait   Gait is normal.  Inspection   Leg length disparity: no discrepancy   Right     Erythema: mild     Effusion: none       Edema: mild       Ecchymosis: none       Deformity: none       Alignment: normal       Previous foot incision: no previous incision         Previous ankle incision: no  previous incision   Left     Left foot/ankle inspection is normal.       Erythema: none       Effusion: none       Edema: none       Ecchymosis: none       Deformity: none       Alignment: normal       Previous foot incision: no previous incision     Previous ankle incision: no previous incision  Palpation   Right     Increased warmth: mild       Masses: none       Crepitus: none       Tenderness: present         ATFL: mild         Anterior lateral joint line: mild         Distal fibula: mild         Achilles tendon: mild     Left      Left foot/ankle palpation is unremarkable.    Range of Motion   Right     Right foot/ankle range of motion is normal.     Left     Left foot/ankle range of motion is normal.    Strength   Right     Right foot/ankle strength is normal.    Left     Left foot/ankle strength is normal.   BP 116/78    Pulse 91    Ht 5' 5.98" (1.676 m)    Wt (!) 305 lb 12.8 oz (138.7 kg)    SpO2 97%    BMI 49.38 kg/m  Wt Readings from Last 3 Encounters:  12/12/21 (!) 305 lb 12.8 oz (138.7 kg)  11/09/21 (!) 318 lb 6.4 oz (144.4 kg)  11/16/20 (!) 311 lb (141.1 kg)    Health Maintenance Due  Topic Date Due   HIV Screening  Never done   Hepatitis C Screening  Never done   TETANUS/TDAP  Never done   COVID-19 Vaccine (4 - Booster for Pfizer series) 05/30/2021    There are no preventive care reminders to display for this patient.   Lab Results  Component Value Date   TSH 2.17 01/12/2020   Lab Results  Component Value Date   WBC 9.1 12/12/2021   HGB 16.3 12/12/2021   HCT 49.0 12/12/2021   MCV 90.6 12/12/2021  PLT 216.0 12/12/2021   Lab Results  Component Value Date   NA 139 01/12/2020   K 3.8 01/12/2020   CO2 29 01/12/2020   GLUCOSE 89 01/12/2020   BUN 13 01/12/2020   CREATININE 0.82 01/12/2020   BILITOT 0.8 01/12/2020   ALKPHOS 67 01/12/2020   AST 13 01/12/2020   ALT 30 01/12/2020   PROT 6.9 01/26/2020   ALBUMIN 4.3 01/12/2020    CALCIUM 9.5 01/12/2020   GFR 109.21 01/12/2020   Lab Results  Component Value Date   CHOL 164 01/12/2020   Lab Results  Component Value Date   HDL 37.70 (L) 01/12/2020   Lab Results  Component Value Date   LDLCALC 91 01/12/2020   Lab Results  Component Value Date   TRIG 180.0 (H) 01/12/2020   Lab Results  Component Value Date   CHOLHDL 4 01/12/2020   Lab Results  Component Value Date   HGBA1C 5.2 01/12/2020       Assessment & Plan:   Problem List Items Addressed This Visit       Other   Right foot pain - Primary   Ankle swelling, right   Swelling of right foot   Relevant Orders   DG Foot Complete Right (Completed)   DG Tibia/Fibula Right (Completed)   US Venous Img Lower Unilateral Right (DVT) (Completed)   CBC with Differential/Platelet (Completed)   Uric acid (Completed)   Rule out DVT by [place], questionable if at the end of gout attack, also has history of abnormal x ray will x ray foot and  tibia fibula right foot.  Needs new orthopedics referral due to new insurance plan.   Red Flags discussed. The patient was given clear instructions to go to ER or return to medical center if any red flags develop, symptoms do not improve, worsen or new problems develop. They verbalized understanding.  Return in about 2 weeks (around 12/26/2021), or if symptoms worsen or fail to improve, for at any time for any worsening symptoms, Go to Emergency room/ urgent care if worse.   No orders of the defined types were placed in this encounter.    Marcille Buffy, FNP

## 2021-12-12 NOTE — Patient Instructions (Signed)
°  Orders Placed This Encounter  Procedures   DG Foot Complete Right   DG Tibia/Fibula Right   US Venous Img Lower Unilateral Right (DVT)   CBC with Differential/Platelet   Uric acid     Rest and elevate the affected painful area.  Apply cold compresses intermittently as needed.  As pain recedes, begin normal activities slowly as tolerated.  Call if symptoms persist.   Foot Pain Many things can cause foot pain. Some common causes are: An injury. A sprain. Arthritis. Blisters. Bunions. Follow these instructions at home: Managing pain, stiffness, and swelling If directed, put ice on the painful area: Put ice in a plastic bag. Place a towel between your skin and the bag. Leave the ice on for 20 minutes, 2-3 times a day.  Activity Do not stand or walk for long periods. Return to your normal activities as told by your health care provider. Ask your health care provider what activities are safe for you. Do stretches to relieve foot pain and stiffness as told by your health care provider. Do not lift anything that is heavier than 10 lb (4.5 kg), or the limit that you are told, until your health care provider says that it is safe. Lifting a lot of weight can put added pressure on your feet. Lifestyle Wear comfortable, supportive shoes that fit you well. Do not wear high heels. Keep your feet clean and dry. General instructions Take over-the-counter and prescription medicines only as told by your health care provider. Rub your foot gently. Pay attention to any changes in your symptoms. Keep all follow-up visits as told by your health care provider. This is important. Contact a health care provider if: Your pain does not get better after a few days of self-care. Your pain gets worse. You cannot stand on your foot. Get help right away if: Your foot is numb or tingling. Your foot or toes are swollen. Your foot or toes turn white or blue. You have warmth and redness along your  foot. Summary Common causes of foot pain are injury, sprain, arthritis, blisters, or bunions. Ice, medicines, and comfortable shoes may help foot pain. Contact your health care provider if your pain does not get better after a few days of self-care. This information is not intended to replace advice given to you by your health care provider. Make sure you discuss any questions you have with your health care provider. Document Revised: 02/15/2021 Document Reviewed: 02/15/2021 Elsevier Patient Education  [employer].

## 2021-12-13 ENCOUNTER — Other Ambulatory Visit: Payer: Self-pay

## 2021-12-13 ENCOUNTER — Other Ambulatory Visit: Payer: Self-pay | Admitting: Adult Health

## 2021-12-13 ENCOUNTER — Encounter: Payer: Self-pay | Admitting: Adult Health

## 2021-12-13 DIAGNOSIS — M25471 Effusion, right ankle: Secondary | ICD-10-CM | POA: Insufficient documentation

## 2021-12-13 DIAGNOSIS — E790 Hyperuricemia without signs of inflammatory arthritis and tophaceous disease: Secondary | ICD-10-CM

## 2021-12-13 DIAGNOSIS — M79671 Pain in right foot: Secondary | ICD-10-CM | POA: Insufficient documentation

## 2021-12-13 DIAGNOSIS — M7989 Other specified soft tissue disorders: Secondary | ICD-10-CM | POA: Insufficient documentation

## 2021-12-13 DIAGNOSIS — R937 Abnormal findings on diagnostic imaging of other parts of musculoskeletal system: Secondary | ICD-10-CM | POA: Insufficient documentation

## 2021-12-13 LAB — CBC WITH DIFFERENTIAL/PLATELET
Basophils Absolute: 0.1 10*3/uL (ref 0.0–0.1)
Basophils Relative: 1.2 % (ref 0.0–3.0)
Eosinophils Absolute: 0.2 10*3/uL (ref 0.0–0.7)
Eosinophils Relative: 1.7 % (ref 0.0–5.0)
HCT: 49 % (ref 39.0–52.0)
Hemoglobin: 16.3 g/dL (ref 13.0–17.0)
Lymphocytes Relative: 31.1 % (ref 12.0–46.0)
Lymphs Abs: 2.8 10*3/uL (ref 0.7–4.0)
MCHC: 33.2 g/dL (ref 30.0–36.0)
MCV: 90.6 fl (ref 78.0–100.0)
Monocytes Absolute: 0.8 10*3/uL (ref 0.1–1.0)
Monocytes Relative: 9.1 % (ref 3.0–12.0)
Neutro Abs: 5.2 10*3/uL (ref 1.4–7.7)
Neutrophils Relative %: 56.9 % (ref 43.0–77.0)
Platelets: 216 10*3/uL (ref 150.0–400.0)
RBC: 5.41 Mil/uL (ref 4.22–5.81)
RDW: 14.4 % (ref 11.5–15.5)
WBC: 9.1 10*3/uL (ref 4.0–10.5)

## 2021-12-13 LAB — URIC ACID: Uric Acid, Serum: 8.1 mg/dL — ABNORMAL HIGH (ref 4.0–7.8)

## 2021-12-13 MED ORDER — COLCHICINE 0.6 MG PO TABS: 1.2000 mg | ORAL_TABLET | Freq: Every day | ORAL | 0 refills | Status: DC

## 2021-12-13 NOTE — Progress Notes (Signed)
Uric acid is mildly elevated, given his history of erythema that has started to resolve and pain in right foot will send in treatment for acute gout.  Colchicine 1.2 mg po x 1 then 0.6 mg one hour later x 1. Do not repeat.  Repeat uric acid in one month and CMP. Keep regular follow up visit with PCP for health maintenance.  CBC is within normal limits.

## 2021-12-13 NOTE — Progress Notes (Signed)
Mild degenerative changes in right mid foot,moderate chronic spurring at the achilles insertion at heel.  Will need orthopedics, let me know if a preference.

## 2021-12-13 NOTE — Progress Notes (Signed)
Minimal chronic changes of connective tissue between bones of knee, usually from over use. No acute fracture, lower leg bones are within normal limits.  Will have him see orthopedics- any idea which location he would like given his new insurance ?

## 2021-12-13 NOTE — Progress Notes (Signed)
No evidence of DVT.

## 2021-12-13 NOTE — Progress Notes (Signed)
No orders of the defined types were placed in this encounter.   Meds ordered this encounter  Medications   colchicine 0.6 MG tablet    Sig: Take 2 tablets (1.2 mg total) by mouth daily for 1 day. Then take 0.6 mg po one hour later.    Dispense:  3 tablet    Refill:  0

## 2021-12-14 NOTE — Addendum Note (Signed)
Addended by: Doreen Beam on: 12/14/2021 02:36 PM   Modules accepted: Orders

## 2021-12-14 NOTE — Telephone Encounter (Signed)
The names of the provider are not in the area. One provider of the name Dr Earma Reading is in [place]. I called pt to inform and I called provider office they do except pt ins. Pt was ok with sending referral to Dr Earma Reading.

## 2021-12-15 NOTE — Progress Notes (Signed)
Subjective:    CC: R foot pain / gout flare  I, [name], LAT, ATC, am serving as scribe for Dr. Clementeen Graham.  HPI: Pt is a 36 y/o male presenting w/ c/o chronic R foot pain w/ recent flare since 12/01/21.  Pt has a hx of gout and plantar fasciitis.  He has been seen by his PCP on 12/12/21 for R dorsal foot pain, redness and swelling and previously by [facility] on 11/25/21 for plantar fasciitis.  Today, pt reports R plantar foot and heel pain since Nov 21, 2021 w/ no known MOI.  He reports that his R foot pain improved until Jan 9th when his R foot pain location changed and his R dorsal and lateral foot became red and swollen.  Since 12/11/21, his R foot pain and swelling has improved but he con't to have some pain in his R dorsal foot.  He locates his pain to his his R dorsal foot.  He has a hx of bone spurs.  R foot/ankle swelling: previously but not currently Aggravating factors: prolonged standing/walking; footwear (dress shoes) Treatments tried: Allopurinol; colchicine but not currently taking; steroids; HEP from [facility]; ice  Diagnostic testing: R tib/fib and R foot XR- 12/12/21; R LE venous doppler US- 12/12/21; Uric acid and CBC- 12/12/21  Pertinent review of Systems: No fevers or chills  Relevant historical information: Currently taking allopurinol 200 mg daily since November. Gout with uric acid greater than 8.  Objective:    Vitals:   12/18/21 0817  BP: 130/84  Pulse: 71  SpO2: 96%   General: Well Developed, well nourished, and in no acute distress.   MSK: Right foot and ankle not particularly swollen Minimally tender palpation along dorsal midfoot along the third ray. Normal foot and ankle motion. Intact strength. Stable ligamentous exam.  Lab and Radiology Results  Diagnostic Limited MSK Ultrasound of: Left foot and ankle Left dorsal midfoot slight bone spur present dorsal midfoot area of maximum pain along the third ray.  Minimal joint effusion  present. Achilles tendon calcification present at Achilles tendon insertion consistent with osteophyte and Haglund's deformity. Some soft tissue hypoechoic change superficial to Achilles tendon. Plantar fascia slightly thickened with calcaneal spur present. Normal-appearing lateral ankle tendons. Impression: Midfoot DJD.  Calcific Achilles tendinitis.  Plantar calcaneal heel spur.   EXAM: RIGHT FOOT COMPLETE - 3+ VIEW   COMPARISON:  None.   FINDINGS: Normal bone mineralization. Mild dorsal talonavicular degenerative osteophytosis. Small calcaneal heel spur. Moderate chronic spurring at the Achilles insertion on the calcaneus. No acute fracture or dislocation.   IMPRESSION: Small calcaneal heel spur and moderate chronic spurring at the Achilles insertion on the calcaneus.   Mild midfoot osteoarthritis.     Electronically Signed   By: Neita Garnet M.D.   On: 12/12/2021 17:30    EXAM: RIGHT TIBIA AND FIBULA - 2 VIEW   COMPARISON:  None.   FINDINGS: Minimal chronic enthesopathic change at the patellar origin off of the patella. Normal bone mineralization within the tibia and fibula. No acute fracture is seen.   IMPRESSION: No acute fracture.     Electronically Signed   By: Neita Garnet M.D.   On: 12/12/2021 17:31   I, Clementeen Graham, personally (independently) visualized and performed the interpretation of the images attached in this note.  Lab Results  Component Value Date   LABURIC 8.1 (H) 12/12/2021     Chemistry      Component Value Date/Time   NA  139 01/12/2020 0804   K 3.8 01/12/2020 0804   CL 101 01/12/2020 0804   CO2 29 01/12/2020 0804   BUN 13 01/12/2020 0804   CREATININE 0.82 01/12/2020 0804      Component Value Date/Time   CALCIUM 9.5 01/12/2020 0804   ALKPHOS 67 01/12/2020 0804   AST 13 01/12/2020 0804   ALT 30 01/12/2020 0804   BILITOT 0.8 01/12/2020 0804       Impression and Recommendations:    Assessment and Plan: 36 y.o. male  with right foot and ankle pain and swelling.  Pain originally was at the posterior plantar foot consistent with Achilles tendinitis and plantar fasciitis.  This resolved with a course of oral steroids and some home exercise program arranged by [facility].  However he then developed lateral forefoot and dorsal midfoot pain and swelling.  I think this was secondary to limping because of his plantar and posterior calcaneus pain.  This also resolved with prednisone and a bit of time.  He never did take the colchicine 3 pill course that was prescribed.  Today he is feeling better so I do not think much acute action is needed.  However his gout is not as well-controlled as I was like.  His uric acid is currently 8.1 on allopurinol 200 mg daily.  Goal for gout is to get it under 6.0.  I am estimating that he is going to take around 600 mg of allopurinol so we will go ahead and prescribe that right now.  Additionally by rapidly adjusting his allopurinol dose he may have a gout flare so we will put him on colchicine prophylaxis of 0.6 mg daily for 2 months and after that he can take it daily as needed for gout flares.  Plan to recheck in 6 weeks.  At that time we will recheck his uric acid and metabolic panel.  Otherwise recheck sooner if needed.  Certainly could use a cam walker boot temporarily if needed for back pain.Marland Kitchen  PDMP not reviewed this encounter. Orders Placed This Encounter  Procedures   [place] LIMITED JOINT SPACE STRUCTURES LOW RIGHT(NO LINKED CHARGES)    Order Specific Question:   Reason for Exam (SYMPTOM  OR DIAGNOSIS REQUIRED)    Answer:   R foot pain    Order Specific Question:   Preferred imaging location?    Answer:   [place]   Meds ordered this encounter  Medications   colchicine 0.6 MG tablet    Sig: Take 1 tablet (0.6 mg total) by mouth daily.    Dispense:  30 tablet    Refill:  3   allopurinol (ZYLOPRIM) 300 MG tablet    Sig: Take 2 tablets (600  mg total) by mouth daily.    Dispense:  60 tablet    Refill:  6    Discussed warning signs or symptoms. Please see discharge instructions. Patient expresses understanding.   The above documentation has been reviewed and is accurate and complete Lynne Leader, M.D.

## 2021-12-18 ENCOUNTER — Ambulatory Visit: Payer: 59

## 2021-12-18 ENCOUNTER — Ambulatory Visit (INDEPENDENT_AMBULATORY_CARE_PROVIDER_SITE_OTHER): Payer: 59 | Admitting: Family Medicine

## 2021-12-18 ENCOUNTER — Other Ambulatory Visit: Payer: Self-pay

## 2021-12-18 ENCOUNTER — Encounter: Payer: Self-pay | Admitting: Family Medicine

## 2021-12-18 VITALS — BP 130/84 | HR 71 | Ht 65.98 in | Wt 308.4 lb

## 2021-12-18 DIAGNOSIS — M79671 Pain in right foot: Secondary | ICD-10-CM | POA: Diagnosis not present

## 2021-12-18 DIAGNOSIS — M1A0710 Idiopathic chronic gout, right ankle and foot, without tophus (tophi): Secondary | ICD-10-CM | POA: Diagnosis not present

## 2021-12-18 MED ORDER — ALLOPURINOL 300 MG PO TABS: 600.0000 mg | ORAL_TABLET | Freq: Every day | ORAL | 6 refills | Status: DC

## 2021-12-18 MED ORDER — COLCHICINE 0.6 MG PO TABS: 0.6000 mg | ORAL_TABLET | Freq: Every day | ORAL | 3 refills | Status: DC

## 2021-12-18 NOTE — Patient Instructions (Addendum)
Nice to meet you.  I've prescribed colchicine and allopurinol.  Take both daily as prescribed until your follow-up and will determine whether or not you need to con't the colchicine at that time.  Follow-up: 6 weeks

## 2021-12-26 ENCOUNTER — Ambulatory Visit: Payer: 59 | Admitting: Adult Health

## 2022-01-25 NOTE — Progress Notes (Deleted)
? ?  I, [name], LAT, ATC, am serving as scribe for Dr. Clementeen Graham. ? ?Michael Hardin is a 36 y.o. male who presents to [facility] today for f/u of R foot pain, most recently dorsal foot pain.  He was last seen by Dr. Denyse Amass on 12/18/21 and was prescribed a higher dosage of allopurinol and a prophylactic dosage of colchicine.  Previously, he's been treated for both gout and plantar fasciitis/Achille's tendinitis.  Today, pt reports  ? ?Diagnostic testing: R tib/fib and R foot XR- 12/12/21; R LE venous doppler US- 12/12/21; Uric acid and CBC- 12/12/21 ?Pertinent review of systems: *** ? ?Relevant historical information: *** ? ? ?Exam:  ?There were no vitals taken for this visit. ?General: Well Developed, well nourished, and in no acute distress.  ? ?MSK: *** ? ? ? ?Lab and Radiology Results ?No results found for this or any previous visit (from the past 72 hour(s)). ?No results found. ? ? ? ? ?Assessment and Plan: ?36 y.o. male with *** ? ? ?PDMP not reviewed this encounter. ?No orders of the defined types were placed in this encounter. ? ?No orders of the defined types were placed in this encounter. ? ? ? ?Discussed warning signs or symptoms. Please see discharge instructions. Patient expresses understanding. ? ? ?*** ? ?

## 2022-01-29 ENCOUNTER — Ambulatory Visit: Payer: 59 | Admitting: Family Medicine

## 2022-01-30 NOTE — Progress Notes (Signed)
? ?  I, [name], LAT, ATC, am serving as scribe for Dr. Clementeen Graham. ? ?Michael Hardin is a 36 y.o. male who presents to [facility] at [facility] today for f/u of multiple sources of chronic pain in R foot including plantar fasciitis, gout and R dorsal/lateral foot pain.  He was last seen by Dr. Denyse Amass on 12/18/21 and was advised to increase his allopurinol dosage and was placed on a prophylactic dose of colchicine.  Today, pt reports that his R foot is better.  He con't to take both the allopurinol and the colchicine.   ? ?Diagnostic testing: R tib/fib and R foot XR- 12/12/21; R LE venous doppler US- 12/12/21; Uric acid and CBC- 12/12/21 ? ?Pertinent review of systems: No fevers or chills ? ?Relevant historical information: Hypertension and obesity ? ? ?Exam:  ?BP 114/80 (BP Location: Right Arm, Patient Position: Sitting, Cuff Size: Large)   Pulse 70   Ht 5' 5.98" (1.676 m)   Wt (!) 310 lb 9.6 oz (140.9 kg)   SpO2 98%   BMI 50.16 kg/m?  ?General: Well Developed, well nourished, and in no acute distress.  ? ?MSK: Normal gait ? ? ? ?Lab and Radiology Results ?Lab Results  ?Component Value Date  ? LABURIC 8.1 (H) 12/12/2021  ? ? ? ? ? ? ?Assessment and Plan: ?36 y.o. male with gout.  Much improved clinically.  Has not had any flareups since his last visit.  He tolerates the prophylactic colchicine currently and the higher dose of allopurinol at 600 mg/day. ?Plan to check his uric acid and metabolic panel today and discontinue the prophylactic colchicine and just take it as needed from now on. ?Recheck him yearly or his primary care provider can take over the allopurinol dosing and uric acid checking yearly follow-up as well. ?Goal of gout management is to get uric acid less than or equal to 6. ? ? ?PDMP not reviewed this encounter. ?Orders Placed This Encounter  ?Procedures  ? Uric acid  ?  Standing Status:   Future  ?  Number of Occurrences:   1  ?  Standing Expiration Date:   02/01/2023  ? Basic  Metabolic Panel (BMET)  ?  Standing Status:   Future  ?  Number of Occurrences:   1  ?  Standing Expiration Date:   02/01/2023  ? ?No orders of the defined types were placed in this encounter. ? ? ? ?Discussed warning signs or symptoms. Please see discharge instructions. Patient expresses understanding. ? ? ?The above documentation has been reviewed and is accurate and complete Clementeen Graham, M.D. ? ? ?

## 2022-01-31 ENCOUNTER — Other Ambulatory Visit: Payer: Self-pay

## 2022-01-31 ENCOUNTER — Ambulatory Visit (INDEPENDENT_AMBULATORY_CARE_PROVIDER_SITE_OTHER): Payer: 59 | Admitting: Family Medicine

## 2022-01-31 ENCOUNTER — Encounter: Payer: Self-pay | Admitting: Family Medicine

## 2022-01-31 VITALS — BP 114/80 | HR 70 | Ht 65.98 in | Wt 310.6 lb

## 2022-01-31 DIAGNOSIS — M1A0710 Idiopathic chronic gout, right ankle and foot, without tophus (tophi): Secondary | ICD-10-CM | POA: Diagnosis not present

## 2022-01-31 DIAGNOSIS — M79671 Pain in right foot: Secondary | ICD-10-CM | POA: Diagnosis not present

## 2022-01-31 DIAGNOSIS — Z5181 Encounter for therapeutic drug level monitoring: Secondary | ICD-10-CM

## 2022-01-31 LAB — BASIC METABOLIC PANEL
BUN: 16 mg/dL (ref 6–23)
CO2: 28 mEq/L (ref 19–32)
Calcium: 9.7 mg/dL (ref 8.4–10.5)
Chloride: 103 mEq/L (ref 96–112)
Creatinine, Ser: 0.83 mg/dL (ref 0.40–1.50)
GFR: 114.94 mL/min (ref >60.00)
Glucose, Bld: 112 mg/dL — ABNORMAL HIGH (ref 70–99)
Potassium: 3.9 mEq/L (ref 3.5–5.1)
Sodium: 140 mEq/L (ref 135–145)

## 2022-01-31 LAB — URIC ACID: Uric Acid, Serum: 5.9 mg/dL (ref 4.0–7.8)

## 2022-01-31 MED ORDER — ALLOPURINOL 300 MG PO TABS: 600.0000 mg | ORAL_TABLET | Freq: Every day | ORAL | 3 refills | Status: DC

## 2022-01-31 NOTE — Progress Notes (Signed)
Uric acid is much better at 5.9.  You are at goal.  Plan to continue current dose of allopurinol.  Okay to just take colchicine as needed at this point. ?I sent a new prescription for allopurinol to your pharmacy for a 90-day supply with 3 refills.

## 2022-01-31 NOTE — Addendum Note (Signed)
Addended by: Rodolph Bong on: 01/31/2022 01:23 PM ? ? Modules accepted: Orders ? ?

## 2022-01-31 NOTE — Patient Instructions (Addendum)
Good to see you today. ? ?Glad you're feeling better. ? ?Please get labs today before you leave. ? ?Follow-up: as needed or yearly ?

## 2022-02-02 ENCOUNTER — Telehealth: Payer: 59 | Admitting: Family

## 2022-02-02 DIAGNOSIS — J019 Acute sinusitis, unspecified: Secondary | ICD-10-CM | POA: Diagnosis not present

## 2022-02-02 MED ORDER — CETIRIZINE HCL 10 MG PO TABS: 10.0000 mg | ORAL_TABLET | Freq: Every day | ORAL | 1 refills | Status: AC

## 2022-02-02 MED ORDER — AMOXICILLIN-POT CLAVULANATE 875-125 MG PO TABS: 1.0000 | ORAL_TABLET | Freq: Two times a day (BID) | ORAL | 0 refills | Status: DC

## 2022-02-02 NOTE — Progress Notes (Signed)
?Virtual Visit Consent  ? ?Michael Hardin, you are scheduled for a virtual visit with a [facility] provider today.   ?  ?Just as with appointments in the office, your consent must be obtained to participate.  Your consent will be active for this visit and any virtual visit you may have with one of our providers in the next 365 days.   ?  ?If you have a MyChart account, a copy of this consent can be sent to you electronically.  All virtual visits are billed to your insurance company just like a traditional visit in the office.   ? ?As this is a virtual visit, video technology does not allow for your provider to perform a traditional examination.  This may limit your provider's ability to fully assess your condition.  If your provider identifies any concerns that need to be evaluated in person or the need to arrange testing (such as labs, EKG, etc.), we will make arrangements to do so.   ?  ?Although advances in technology are sophisticated, we cannot ensure that it will always work on either your end or our end.  If the connection with a video visit is poor, the visit may have to be switched to a telephone visit.  With either a video or telephone visit, we are not always able to ensure that we have a secure connection.    ? ?I need to obtain your verbal consent now.   Are you willing to proceed with your visit today?  ?  ?Michael Hardin has provided verbal consent on 02/02/2022 for a virtual visit (video or telephone). ?  ?Michael Dun, FNP  ? ?Date: 02/02/2022 8:33 AM ? ? ?Virtual Visit via Video Note  ? Michael Hardin, connected with  Michael Hardin  ([id number], [birth year]) on 02/02/22 at  8:45 AM EST by a video-enabled telemedicine application and verified that I am speaking with the correct person using two identifiers. ? ?Location: ?Patient: Virtual Visit Location Patient: Home ?Provider: Virtual Visit Location Provider: Home Office ?  ?I discussed the limitations of evaluation and management by telemedicine and  the availability of in person appointments. The patient expressed understanding and agreed to proceed.   ? ?History of Present Illness: ?Michael Hardin is a 36 y.o. who identifies as a male who was assigned male at birth, and is being seen today for allergies that started last week. Has not taken a COVID test.  ? ?HPI: Sinus Problem ?This is a new problem. The current episode started 1 to 4 weeks ago. The problem has been gradually worsening since onset. Associated symptoms include congestion, coughing, headaches, a hoarse voice, shortness of breath and sinus pressure. Pertinent negatives include no chills, ear pain (congestion), sneezing or sore throat. Treatments tried: flonase, mucinex. The treatment provided mild relief.   ?Problems:  ?Patient Active Problem List  ? Diagnosis Date Noted  ?? Morbid obesity ([place]) 01/31/2022  ?? Idiopathic chronic gout of right foot without tophus 12/18/2021  ?? Abnormal bone xray 12/13/2021  ?? COVID-19 11/16/2020  ?? HTN (hypertension) 01/18/2020  ?? Sebaceous cyst 01/05/2020  ?? Umbilical hernia 51/88/4166  ?? Sleep apnea 12/30/2019  ?? Skin mass 12/30/2019  ?? Family history of multiple myeloma 12/30/2019  ?? Encounter to establish care 12/30/2019  ?? Headache 12/30/2019  ?  ?Allergies: No Known Allergies ?Medications:  ?Current Outpatient Medications:  ??  amoxicillin-clavulanate (AUGMENTIN) 875-125 MG tablet, Take 1 tablet by mouth 2 (two) times daily., Disp: 14 tablet,  Rfl: 0 ??  cetirizine (ZYRTEC ALLERGY) 10 MG tablet, Take 1 tablet (10 mg total) by mouth daily., Disp: 90 tablet, Rfl: 1 ??  allopurinol (ZYLOPRIM) 300 MG tablet, Take 2 tablets (600 mg total) by mouth daily., Disp: 180 tablet, Rfl: 3 ??  colchicine 0.6 MG tablet, Take 1 tablet (0.6 mg total) by mouth daily., Disp: 30 tablet, Rfl: 3 ? ?Observations/Objective: ?Patient is well-developed, well-nourished in no acute distress.  ?Resting comfortably  at home.  ?Head is normocephalic, atraumatic.  ?No labored  breathing.  ?Speech is clear and coherent with logical content.  ?Patient is alert and oriented at baseline.  ?Nasal congestion ? ?Assessment and Plan: ?1. Acute sinusitis, recurrence not specified, unspecified location ?- cetirizine (ZYRTEC ALLERGY) 10 MG tablet; Take 1 tablet (10 mg total) by mouth daily.  Dispense: 90 tablet; Refill: 1 ?- amoxicillin-clavulanate (AUGMENTIN) 875-125 MG tablet; Take 1 tablet by mouth 2 (two) times daily.  Dispense: 14 tablet; Refill: 0 ? ?- Take meds as prescribed ?- Use a cool mist humidifier  ?-Use saline nose sprays frequently ?-Force fluids ?-For any cough or congestion ? Use plain Mucinex- regular strength or max strength is fine ?-For fever or aces or pains- take tylenol or ibuprofen. ?-Throat lozenges if help ?-New toothbrush in 3 days ?- Follow up if symptoms worsen or do not improve  ? ?Follow Up Instructions: ?I discussed the assessment and treatment plan with the patient. The patient was provided an opportunity to ask questions and all were answered. The patient agreed with the plan and demonstrated an understanding of the instructions.  A copy of instructions were sent to the patient via MyChart unless otherwise noted below.  ? ? ? ?The patient was advised to call back or seek an in-person evaluation if the symptoms worsen or if the condition fails to improve as anticipated. ? ?Time:  ?I spent 6 minutes with the patient via telehealth technology discussing the above problems/concerns.   ? ?Michael Dun, FNP ? ?

## 2022-03-29 NOTE — Progress Notes (Signed)
? ?I, [name], LAT, ATC, am serving as scribe for Dr. Clementeen Graham. ? ?Michael Hardin is a 36 y.o. male who presents to [facility] at [facility] today for R shoulder pain.  He was last seen by Dr. Denyse Amass on 01/31/22 for f/u of R foot pain.  Today, pt reports R shoulder pain x couple of years that worsened last Wednesday for no known reason.  He locates his pain to his  R superior shoulder . ? ?Radiating pain: yes into the R arm (tricep) and into his forearm ?Neck pain: yes ?R shoulder mechanical symptoms: yes w/ functional IR ?R UE paresthesias: yes in his R tricep and R webspace ?Aggravating factors: R shoulder endrange overhead AROM; R shoulder functional IR; R sidelying ?Treatments tried: Advil/Tylenol combo;  ? ? ?Pertinent review of systems: No fevers or chills ? ?Relevant historical information: History of gout ? ? ?Exam:  ?BP 120/72 (BP Location: Left Arm, Patient Position: Sitting, Cuff Size: Large)   Pulse 83   Ht 5' 5.98" (1.676 m)   Wt (!) 307 lb 3.2 oz (139.3 kg)   SpO2 95%   BMI 49.61 kg/m?  ?General: Well Developed, well nourished, and in no acute distress.  ? ?MSK: C-spine: Normal-appearing ?Nontender midline. ?Normal cervical motion. ?Intact strength upper extremities bilaterally. ?Reflexes are intact. ? ?Right shoulder: Normal-appearing ?Nontender. ?Normal motion pain with abduction. ?Positive Hawkins and Neer's test. ?Negative Yergason's and speeds test. ?Intact strength. ? ? ? ?Lab and Radiology Results ? ?X-ray images C-spine and right shoulder obtained today personally and independently interpreted ? ?C-spine: Mild DDD.  Loss of cervical lordosis indicating spasm.  No acute fractures. ? ?Right shoulder: No acute fractures.  No significant DJD. ? ?Await formal radiology review ? ? ?Assessment and Plan: ?36 y.o. male with  ?Multifactorial right shoulder and neck and arm pain. ? ?Some of the shoulder pain is due to his rotator cuff tendinopathy/impingement or bursitis.   Plan to treat with physical therapy.  Emergency backup plan for prednisone if needed.  I have prescribed it but I do not expect him to take it currently. ? ?Additionally he does have some cervical radiculopathy as well likely at C6 and C7 dermatomal pattern.  Again physical therapy treatment.  We will use gabapentin as well and again emergency backup plan for prednisone. ? ?Recheck in 2 months.  Return sooner if needed. ? ?PDMP not reviewed this encounter. ?Orders Placed This Encounter  ?Procedures  ? DG Cervical Spine 2 or 3 views  ?  Standing Status:   Future  ?  Number of Occurrences:   1  ?  Standing Expiration Date:   05/03/2022  ?  Order Specific Question:   Reason for Exam (SYMPTOM  OR DIAGNOSIS REQUIRED)  ?  Answer:   neck pain  ?  Order Specific Question:   Preferred imaging location?  ?  Answer:   [facility]  ? DG Shoulder Right  ?  Standing Status:   Future  ?  Number of Occurrences:   1  ?  Standing Expiration Date:   05/03/2022  ?  Order Specific Question:   Reason for Exam (SYMPTOM  OR DIAGNOSIS REQUIRED)  ?  Answer:   R shoulder pain  ?  Order Specific Question:   Preferred imaging location?  ?  Answer:   [facility]  ? Ambulatory referral to Physical Therapy  ?  Referral Priority:   Routine  ?  Referral Type:  Physical Medicine  ?  Referral Reason:   Specialty Services Required  ?  Requested Specialty:   Physical Therapy  ?  Number of Visits Requested:   1  ? ?Meds ordered this encounter  ?Medications  ? predniSONE (DELTASONE) 50 MG tablet  ?  Sig: Take 1 tablet (50 mg total) by mouth daily with breakfast for 5 days.  ?  Dispense:  5 tablet  ?  Refill:  0  ? gabapentin (NEURONTIN) 300 MG capsule  ?  Sig: Take 1 capsule (300 mg total) by mouth 3 (three) times daily.  ?  Dispense:  90 capsule  ?  Refill:  2  ? ? ? ?Discussed warning signs or symptoms. Please see discharge instructions. Patient expresses understanding. ? ? ?The above documentation has been reviewed and is accurate and  complete Clementeen Graham, M.D. ? ? ?

## 2022-04-02 ENCOUNTER — Ambulatory Visit (INDEPENDENT_AMBULATORY_CARE_PROVIDER_SITE_OTHER): Payer: 59

## 2022-04-02 ENCOUNTER — Ambulatory Visit: Payer: Self-pay

## 2022-04-02 ENCOUNTER — Encounter: Payer: Self-pay | Admitting: Family Medicine

## 2022-04-02 ENCOUNTER — Ambulatory Visit (INDEPENDENT_AMBULATORY_CARE_PROVIDER_SITE_OTHER): Payer: 59 | Admitting: Family Medicine

## 2022-04-02 VITALS — BP 120/72 | HR 83 | Ht 65.98 in | Wt 307.2 lb

## 2022-04-02 DIAGNOSIS — G8929 Other chronic pain: Secondary | ICD-10-CM

## 2022-04-02 DIAGNOSIS — M542 Cervicalgia: Secondary | ICD-10-CM

## 2022-04-02 DIAGNOSIS — M25511 Pain in right shoulder: Secondary | ICD-10-CM | POA: Diagnosis not present

## 2022-04-02 DIAGNOSIS — M5010 Cervical disc disorder with radiculopathy, unspecified cervical region: Secondary | ICD-10-CM | POA: Diagnosis not present

## 2022-04-02 MED ORDER — GABAPENTIN 300 MG PO CAPS: 300.0000 mg | ORAL_CAPSULE | Freq: Three times a day (TID) | ORAL | 2 refills | Status: DC

## 2022-04-02 MED ORDER — PREDNISONE 50 MG PO TABS: 50.0000 mg | ORAL_TABLET | Freq: Every day | ORAL | 0 refills | Status: AC

## 2022-04-02 NOTE — Patient Instructions (Addendum)
Good to see you today. ? ?Referral has been placed for PT at [facility] @ [facility]. Please call to schedule at [phone].  ? ?Gabapentin and prednisone sent to your pharmacy. ? ?Please get an Xray today before you leave. ? ?Follow-up: 2 months ?

## 2022-04-02 NOTE — Progress Notes (Signed)
Cervical spine x-ray shows a little bit of arthritis.

## 2022-04-02 NOTE — Progress Notes (Signed)
Right shoulder x-ray looks normal to radiology

## 2022-04-18 ENCOUNTER — Ambulatory Visit: Payer: 59

## 2022-04-22 ENCOUNTER — Other Ambulatory Visit: Payer: Self-pay | Admitting: Family Medicine

## 2022-04-24 NOTE — Therapy (Unsigned)
OUTPATIENT PHYSICAL THERAPY CERVICAL EVALUATION   Patient Name: Michael Hardin MRN: [id number] DOB:[birth year], 36 y.o., male Today's Date: 04/24/2022    Past Medical History:  Diagnosis Date   Gout    in ankle   Headache 12/30/2019   Hypertension    Migraines    Past Surgical History:  Procedure Laterality Date   ARTHOSCOPIC ROTAOR CUFF REPAIR Left    Patient Active Problem List   Diagnosis Date Noted   Morbid obesity ([place]) 01/31/2022   Idiopathic chronic gout of right foot without tophus 12/18/2021   Abnormal bone xray 12/13/2021   COVID-19 11/16/2020   HTN (hypertension) 01/18/2020   Sebaceous cyst 16/38/4536   Umbilical hernia 46/80/3212   Sleep apnea 12/30/2019   Skin mass 12/30/2019   Family history of multiple myeloma 12/30/2019   Encounter to establish care 12/30/2019   Headache 12/30/2019    PCP: Burnard Hawthorne, FNP  REFERRING PROVIDER: Gregor Hams, MD  REFERRING DIAG: [id number] (ICD-10-CM) - Chronic right shoulder pain M54.2 (ICD-10-CM) - Neck pain M50.10 (ICD-10-CM) - Cervical disc disorder with radiculopathy of cervical region   THERAPY DIAG: M25.511,G89.29 (ICD-10-CM) - Chronic right shoulder pain M54.2 (ICD-10-CM) - Neck pain M50.10 (ICD-10-CM) - Cervical disc disorder with radiculopathy of cervical region    Rationale for Evaluation and Treatment Rehabilitation  ONSET DATE: 04/02/2022 (referral date)  SUBJECTIVE:                                                                                                                                                                                                         SUBJECTIVE STATEMENT: ***  PERTINENT HISTORY:  36 y.o. male with  Multifactorial right shoulder and neck and arm pain.   Some of the shoulder pain is due to his rotator cuff tendinopathy/impingement or bursitis.  Plan to treat with physical therapy.  Emergency backup plan for prednisone if needed.  I have prescribed it but I do not  expect him to take it currently.   Additionally he does have some cervical radiculopathy as well likely at C6 and C7 dermatomal pattern.  Again physical therapy treatment.  We will use gabapentin as well and again emergency backup plan for prednisone.   Recheck in 2 months.  Return sooner if needed.  PAIN:  Are you having pain? {OPRCPAIN:27236}  PRECAUTIONS: None  WEIGHT BEARING RESTRICTIONS No  FALLS:  Has patient fallen in last 6 months? {fallsyesno:27318}  LIVING ENVIRONMENT: Lives with: {OPRC lives with:25569::"lives with their family"} Lives in: {Lives in:25570} Stairs: {opstairs:27293} Has following equipment at home: {Assistive devices:23999}  OCCUPATION: ***  PLOF: {PLOF:24004}  PATIENT GOALS ***  OBJECTIVE:   DIAGNOSTIC FINDINGS:  CLINICAL DATA:  Chronic neck pain radiates into right shoulder.   EXAM: CERVICAL SPINE - 2-3 VIEW   COMPARISON:  None Available.   FINDINGS: No sagittal spondylolisthesis. The atlantodens interval is intact. Vertebral body heights are maintained. Minimal C3-4 disc space narrowing and anterior endplate osteophytes. The lateral masses of C1 are symmetrically aligned with the dens on the open-mouth odontoid view. No prevertebral soft tissue swelling.   IMPRESSION: Normal alignment.   Minimal C3-4 disc space narrowing and anterior endplate osteophytes.     Electronically Signed   By: Yvonne Kendall M.D.   On: 04/02/2022 11:23 CLINICAL DATA:  Chronic neck pain radiates into right shoulder.   EXAM: RIGHT SHOULDER - 2+ VIEW   COMPARISON:  None Available.   FINDINGS: The glenohumeral and acromioclavicular joints are appropriately aligned with joint spaces maintained. No acute fracture or dislocation. The visualized portion of the right lung is unremarkable.   IMPRESSION: Normal right shoulder radiographs.     Electronically Signed   By: Yvonne Kendall M.D.   On: 04/02/2022 11:22  PATIENT SURVEYS:  {rehab  surveys:24030}   COGNITION: Overall cognitive status: {cognition:24006}   SENSATION: {sensation:27233}  POSTURE: {posture:25561}  PALPATION: ***   CERVICAL ROM:   {AROM/PROM:27142} ROM A/PROM (deg) eval  Flexion   Extension   Right lateral flexion   Left lateral flexion   Right rotation   Left rotation    (Blank rows = not tested)  UPPER EXTREMITY ROM:  {AROM/PROM:27142} ROM Right eval Left eval  Shoulder flexion    Shoulder extension    Shoulder abduction    Shoulder adduction    Shoulder extension    Shoulder internal rotation    Shoulder external rotation    Elbow flexion    Elbow extension    Wrist flexion    Wrist extension    Wrist ulnar deviation    Wrist radial deviation    Wrist pronation    Wrist supination     (Blank rows = not tested)  UPPER EXTREMITY MMT:  MMT Right eval Left eval  Shoulder flexion    Shoulder extension    Shoulder abduction    Shoulder adduction    Shoulder extension    Shoulder internal rotation    Shoulder external rotation    Middle trapezius    Lower trapezius    Elbow flexion    Elbow extension    Wrist flexion    Wrist extension    Wrist ulnar deviation    Wrist radial deviation    Wrist pronation    Wrist supination    Grip strength     (Blank rows = not tested)  CERVICAL SPECIAL TESTS:  {Cervical special tests:25246}   FUNCTIONAL TESTS:  {Functional tests:24029}  PATIENT SURVEYS:  {rehab surveys:24030:a}  TODAY'S TREATMENT:  ***   PATIENT EDUCATION:  Education details: *** Person educated: {Person educated:25204} Education method: {Education Method:25205} Education comprehension: {Education Comprehension:25206}   HOME EXERCISE PROGRAM: ***  ASSESSMENT:  CLINICAL IMPRESSION: Patient is a *** y.o. *** who was seen today for physical therapy evaluation and treatment for ***.    OBJECTIVE IMPAIRMENTS {opptimpairments:25111}.   ACTIVITY LIMITATIONS  {activitylimitations:27494}  PARTICIPATION LIMITATIONS: {participationrestrictions:25113}  PERSONAL FACTORS {Personal factors:25162} are also affecting patient's functional outcome.   REHAB POTENTIAL: {rehabpotential:25112}  CLINICAL DECISION MAKING: {clinical decision making:25114}  EVALUATION COMPLEXITY: {Evaluation complexity:25115}   GOALS: Goals reviewed with patient? {yes/no:20286}  SHORT TERM GOALS: Target date: {follow up:25551}   *** Baseline: *** Goal status: {GOALSTATUS:25110}  2.  *** Baseline: *** Goal status: {GOALSTATUS:25110}  3.  *** Baseline: *** Goal status: {GOALSTATUS:25110}  4.  *** Baseline: *** Goal status: {GOALSTATUS:25110}  5.  *** Baseline: *** Goal status: {GOALSTATUS:25110}  6.  *** Baseline: *** Goal status: {GOALSTATUS:25110}  LONG TERM GOALS: Target date: {follow up:25551}  *** Baseline: *** Goal status: {GOALSTATUS:25110}  2.  *** Baseline: *** Goal status: {GOALSTATUS:25110}  3.  *** Baseline: *** Goal status: {GOALSTATUS:25110}  4.  *** Baseline: *** Goal status: {GOALSTATUS:25110}  5.  *** Baseline: *** Goal status: {GOALSTATUS:25110}  6.  *** Baseline: *** Goal status: {GOALSTATUS:25110}   PLAN: PT FREQUENCY: {rehab frequency:25116}  PT DURATION: {rehab duration:25117}  PLANNED INTERVENTIONS: {rehab planned interventions:25118::"Therapeutic exercises","Therapeutic activity","Neuromuscular re-education","Balance training","Gait training","Patient/Family education","Joint mobilization"}  PLAN FOR NEXT SESSION: ***   Lanice Shirts, PT 04/24/2022, 12:48 PM

## 2022-04-27 ENCOUNTER — Ambulatory Visit: Payer: 59

## 2022-05-25 ENCOUNTER — Ambulatory Visit: Payer: 59 | Admitting: Family Medicine

## 2022-06-06 ENCOUNTER — Encounter: Payer: Self-pay | Admitting: Primary Care

## 2022-06-06 ENCOUNTER — Ambulatory Visit (INDEPENDENT_AMBULATORY_CARE_PROVIDER_SITE_OTHER): Payer: 59 | Admitting: Primary Care

## 2022-06-06 VITALS — BP 128/76 | HR 80 | Temp 98.3°F | Ht 69.0 in | Wt 313.8 lb

## 2022-06-06 DIAGNOSIS — G4733 Obstructive sleep apnea (adult) (pediatric): Secondary | ICD-10-CM | POA: Diagnosis not present

## 2022-06-06 DIAGNOSIS — Z9989 Dependence on other enabling machines and devices: Secondary | ICD-10-CM

## 2022-06-06 DIAGNOSIS — G4730 Sleep apnea, unspecified: Secondary | ICD-10-CM | POA: Diagnosis not present

## 2022-06-06 NOTE — Progress Notes (Signed)
@Patient  ID: , male    DOB: 1986/10/21, 36 y.o.   MRN: [id number]  Chief Complaint  Patient presents with   Follow-up    Pt states he has been doing okay since last visit. States he is needing supplies for his CPAP.    Referring provider: [id number], FNP  HPI: 36 year old male. PMH significant for sleep apnea, obesity. Patient of Dr. 32, seen for initial consult on 02/11/20 for excessive daytime sleepiness. HST in April 2021 showed severe obstructive sleep apnea, AHI 32.7. Started on CPAP auto titrate 5-15cm h20. DME company [employer].   Previous [facility] pulmonary encounter: 06/29/2020 Patient contacted today for 4-5 month follow-up for sleep apnea. He states that everything is going well. He is having no issues with CPAP mask or pressure setting. He is sleeping well at night with no day time fatigue. Continue to encourage weight loss and increasing physical activity .   06/06/2022 - Interim hx  Presents today for overdue follow-up for OSA. He is doing well. He is 100% compliant with CPAP and reports benefit from use. Sleeping well through the night. No snoring or residual daytime sleepiness. Mask seal broke, needs order to renew CPAP supplies.  Specifically needs new headgear, tubing attachment and water chamber. He is using nasal mask. DME is [employer].   Airview download 05/06/2022-12/05/2021 Usage days 30/30 days (100%) greater than 4 hours Average usage 8 hours 29 minutes Pressure 5 to 15 cm H2O (10.7cm h20- 95%) Air leaks 10.4/L min AHI 0.6  No Known Allergies  Immunization History  Administered Date(s) Administered   Influenza,inj,Quad PF,6+ Mos 09/21/2018, 09/06/2019, 10/09/2020, 10/08/2021   PFIZER Comirnaty(Gray Top)Covid-19 Tri-Sucrose Vaccine 04/04/2021   PFIZER(Purple Top)SARS-COV-2 Vaccination 02/12/2020, 03/08/2020    Past Medical History:  Diagnosis Date   Gout    in ankle   Headache 12/30/2019   Hypertension    Migraines     Tobacco History: Social  History   Tobacco Use  Smoking Status Never  Smokeless Tobacco Never   Counseling given: Not Answered   Outpatient Medications Prior to Visit  Medication Sig Dispense Refill   allopurinol (ZYLOPRIM) 300 MG tablet Take 2 tablets (600 mg total) by mouth daily. 180 tablet 3   cetirizine (ZYRTEC ALLERGY) 10 MG tablet Take 1 tablet (10 mg total) by mouth daily. 90 tablet 1   colchicine 0.6 MG tablet TAKE 1 TABLET BY MOUTH EVERY DAY 30 tablet 3   gabapentin (NEURONTIN) 300 MG capsule Take 1 capsule (300 mg total) by mouth 3 (three) times daily. 90 capsule 2   No facility-administered medications prior to visit.   Review of Systems  Review of Systems  Constitutional: Negative.   HENT: Negative.    Respiratory: Negative.     Physical Exam  BP 128/76 (BP Location: Left Arm, Patient Position: Sitting, Cuff Size: Large)   Pulse 80   Temp 98.3 F (36.8 C) (Oral)   Ht 5\' 9"  (1.753 m)   Wt (!) 313 lb 12.8 oz (142.3 kg)   SpO2 97% Comment: RA  BMI 46.34 kg/m  Physical Exam Constitutional:      Appearance: Normal appearance.  HENT:     Head: Normocephalic and atraumatic.     Mouth/Throat:     Mouth: Mucous membranes are moist.     Pharynx: Oropharynx is clear.  Cardiovascular:     Rate and Rhythm: Normal rate and regular rhythm.  Pulmonary:     Effort: Pulmonary effort is normal.  Breath sounds: Normal breath sounds.  Musculoskeletal:     Cervical back: Normal range of motion and neck supple.  Skin:    General: Skin is warm and dry.  Neurological:     General: No focal deficit present.     Mental Status: He is alert and oriented to person, place, and time. Mental status is at baseline.  Psychiatric:        Mood and Affect: Mood normal.        Behavior: Behavior normal.        Thought Content: Thought content normal.        Judgment: Judgment normal.      Lab Results:  CBC    Component Value Date/Time   WBC 9.1 12/12/2021 1601   RBC 5.41 12/12/2021 1601   HGB  16.3 12/12/2021 1601   HCT 49.0 12/12/2021 1601   PLT 216.0 12/12/2021 1601   MCV 90.6 12/12/2021 1601   MCHC 33.2 12/12/2021 1601   RDW 14.4 12/12/2021 1601   LYMPHSABS 2.8 12/12/2021 1601   MONOABS 0.8 12/12/2021 1601   EOSABS 0.2 12/12/2021 1601   BASOSABS 0.1 12/12/2021 1601    BMET    Component Value Date/Time   NA 140 01/31/2022 0801   K 3.9 01/31/2022 0801   CL 103 01/31/2022 0801   CO2 28 01/31/2022 0801   GLUCOSE 112 (H) 01/31/2022 0801   BUN 16 01/31/2022 0801   CREATININE 0.83 01/31/2022 0801   CALCIUM 9.7 01/31/2022 0801    BNP No results found for: "BNP"  ProBNP No results found for: "PROBNP"  Imaging: No results found.   Assessment & Plan:   Sleep apnea - HST in April 2021 showed severe obstructive sleep apnea, AHI 32.7. Patient is 100% compliant with CPAP and reports benefit from use. No issues with mask fit or pressure setting. No residual daytime sleepiness. [place] 3.  Current CPAP pressure 5-15 cm H2O;  Residual AHI 0.6/hr. Needs CPAP supplies renewed with [employer]. Fu in 1 year or sooner if needed.    Glenford Bayley, NP 06/06/2022

## 2022-06-06 NOTE — Patient Instructions (Addendum)
Excellent compliance with CPAP, no pressure changes needed today  Recommendations: Continue to wear CPAP every night min 4-6 hours or longer Do not drive experiencing excessive daytime sleepiness or fatigue Continue to work on weight loss efforts Side sleeping position or elevate head of bed 30 degrees at night   Orders: Renew CPAP supplies (Tubing, mask, headgear, water reservoir- DME [employer])  Follow-up: 1 year with Dr. Belia Heman [facility]) or [facility] NP ([place])/ call sooner if needed    CPAP and BIPAP Information CPAP and BIPAP are methods that use air pressure to keep your airways open and to help you breathe well. CPAP and BIPAP use different amounts of pressure. Your health care provider will tell you whether CPAP or BIPAP would be more helpful for you. CPAP stands for "continuous positive airway pressure." With CPAP, the amount of pressure stays the same while you breathe in (inhale) and out (exhale). BIPAP stands for "bi-level positive airway pressure." With BIPAP, the amount of pressure will be higher when you inhale and lower when you exhale. This allows you to take larger breaths. CPAP or BIPAP may be used in the hospital, or your health care provider may want you to use it at home. You may need to have a sleep study before your health care provider can order a machine for you to use at home. What are the advantages? CPAP or BIPAP can be helpful if you have: Sleep apnea. Chronic obstructive pulmonary disease (COPD). Heart failure. Medical conditions that cause muscle weakness, including muscular dystrophy or amyotrophic lateral sclerosis (ALS). Other problems that cause breathing to be shallow, weak, abnormal, or difficult. CPAP and BIPAP are most commonly used for obstructive sleep apnea (OSA) to keep the airways from collapsing when the muscles relax during sleep. What are the risks? Generally, this is a safe treatment. However, problems may occur, including: Irritated skin or  skin sores if the mask does not fit properly. Dry or stuffy nose or nosebleeds. Dry mouth. Feeling gassy or bloated. Sinus or lung infection if the equipment is not cleaned properly. When should CPAP or BIPAP be used? In most cases, the mask only needs to be worn during sleep. Generally, the mask needs to be worn throughout the night and during any daytime naps. People with certain medical conditions may also need to wear the mask at other times, such as when they are awake. Follow instructions from your health care provider about when to use the machine. What happens during CPAP or BIPAP?  Both CPAP and BIPAP are provided by a small machine with a flexible plastic tube that attaches to a plastic mask that you wear. Air is blown through the mask into your nose or mouth. The amount of pressure that is used to blow the air can be adjusted on the machine. Your health care provider will set the pressure setting and help you find the best mask for you. Tips for using the mask Because the mask needs to be snug, some people feel trapped or closed-in (claustrophobic) when first using the mask. If you feel this way, you may need to get used to the mask. One way to do this is to hold the mask loosely over your nose or mouth and then gradually apply the mask more snugly. You can also gradually increase the amount of time that you use the mask. Masks are available in various types and sizes. If your mask does not fit well, talk with your health care provider about getting a different  one. Some common types of masks include: Full face masks, which fit over the mouth and nose. Nasal masks, which fit over the nose. Nasal pillow or prong masks, which fit into the nostrils. If you are using a mask that fits over your nose and you tend to breathe through your mouth, a chin strap may be applied to help keep your mouth closed. Use a skin barrier to protect your skin as told by your health care provider. Some CPAP and  BIPAP machines have alarms that may sound if the mask comes off or develops a leak. If you have trouble with the mask, it is very important that you talk with your health care provider about finding a way to make the mask easier to tolerate. Do not stop using the mask. There could be a negative impact on your health if you stop using the mask. Tips for using the machine Place your CPAP or BIPAP machine on a secure table or stand near an electrical outlet. Know where the on/off switch is on the machine. Follow instructions from your health care provider about how to set the pressure on your machine and when you should use it. Do not eat or drink while the CPAP or BIPAP machine is on. Food or fluids could get pushed into your lungs by the pressure of the CPAP or BIPAP. For home use, CPAP and BIPAP machines can be rented or purchased through home health care companies. Many different brands of machines are available. Renting a machine before purchasing may help you find out which particular machine works well for you. Your health insurance company may also decide which machine you may get. Keep the CPAP or BIPAP machine and attachments clean. Ask your health care provider for specific instructions. Check the humidifier if you have a dry stuffy nose or nosebleeds. Make sure it is working correctly. Follow these instructions at home: Take over-the-counter and prescription medicines only as told by your health care provider. Ask if you can take sinus medicine if your sinuses are blocked. Do not use any products that contain nicotine or tobacco. These products include cigarettes, chewing tobacco, and vaping devices, such as e-cigarettes. If you need help quitting, ask your health care provider. Keep all follow-up visits. This is important. Contact a health care provider if: You have redness or pressure sores on your head, face, mouth, or nose from the mask or head gear. You have trouble using the CPAP or  BIPAP machine. You cannot tolerate wearing the CPAP or BIPAP mask. Someone tells you that you snore even when wearing your CPAP or BIPAP. Get help right away if: You have trouble breathing. You feel confused. Summary CPAP and BIPAP are methods that use air pressure to keep your airways open and to help you breathe well. If you have trouble with the mask, it is very important that you talk with your health care provider about finding a way to make the mask easier to tolerate. Do not stop using the mask. There could be a negative impact to your health if you stop using the mask. Follow instructions from your health care provider about when to use the machine. This information is not intended to replace advice given to you by your health care provider. Make sure you discuss any questions you have with your health care provider. Document Revised: 06/21/2021 Document Reviewed: 10/21/2020 Elsevier Patient Education  2023 [employer].

## 2022-06-06 NOTE — Assessment & Plan Note (Addendum)
-   HST in April 2021 showed severe obstructive sleep apnea, AHI 32.7. Patient is 100% compliant with CPAP and reports benefit from use. No issues with mask fit or pressure setting. No residual daytime sleepiness. Epworth 3.  Current CPAP pressure 5-15 cm H2O;  Residual AHI 0.6/hr. Needs CPAP supplies renewed with [employer]. Fu in 1 year or sooner if needed.

## 2022-12-10 IMAGING — DX DG CERVICAL SPINE 2 OR 3 VIEWS
4 series · 4 of 4 positions shown · non-contrast
Comparison: None Available.

CLINICAL DATA: Chronic neck pain radiates into right shoulder.

EXAM:
CERVICAL SPINE - 2-3 VIEW

[c-spine lat]
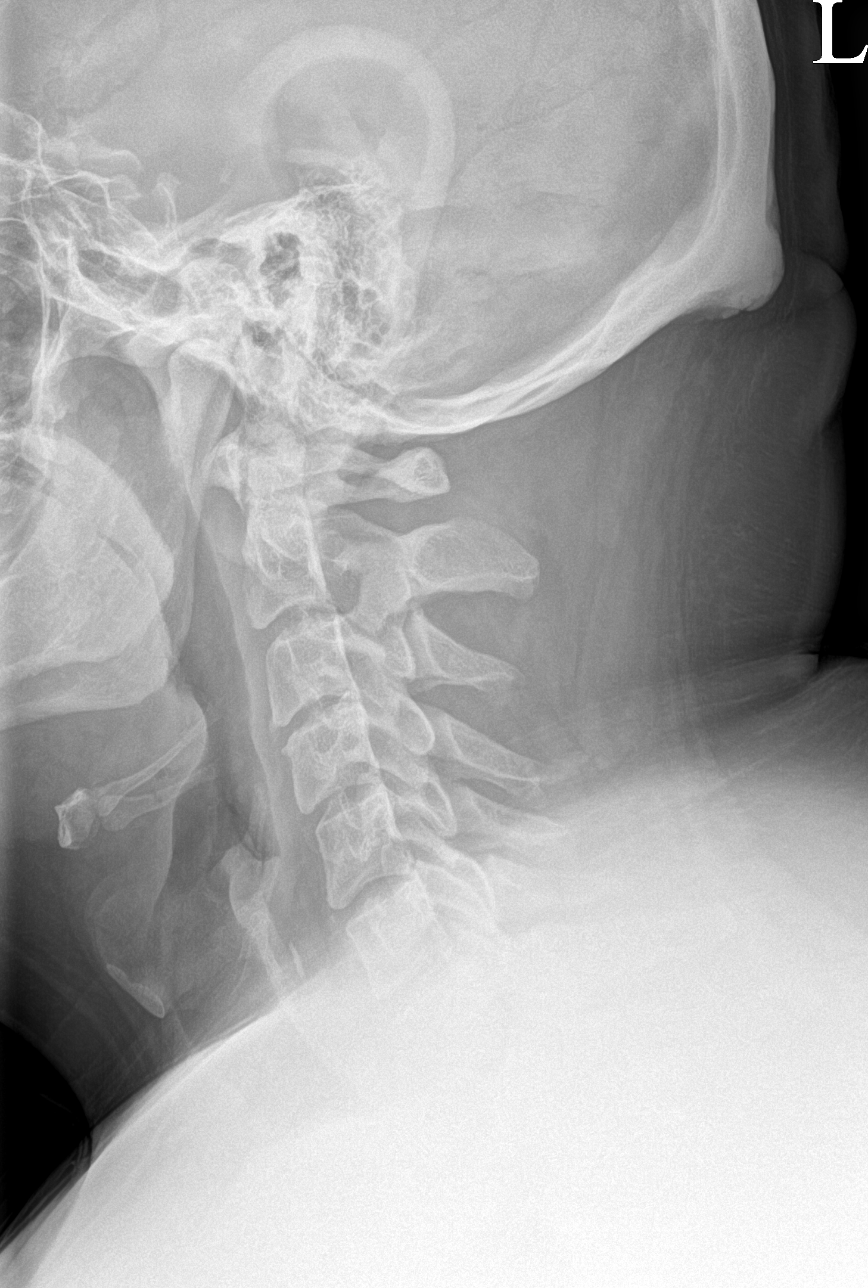

[c-spine ap]
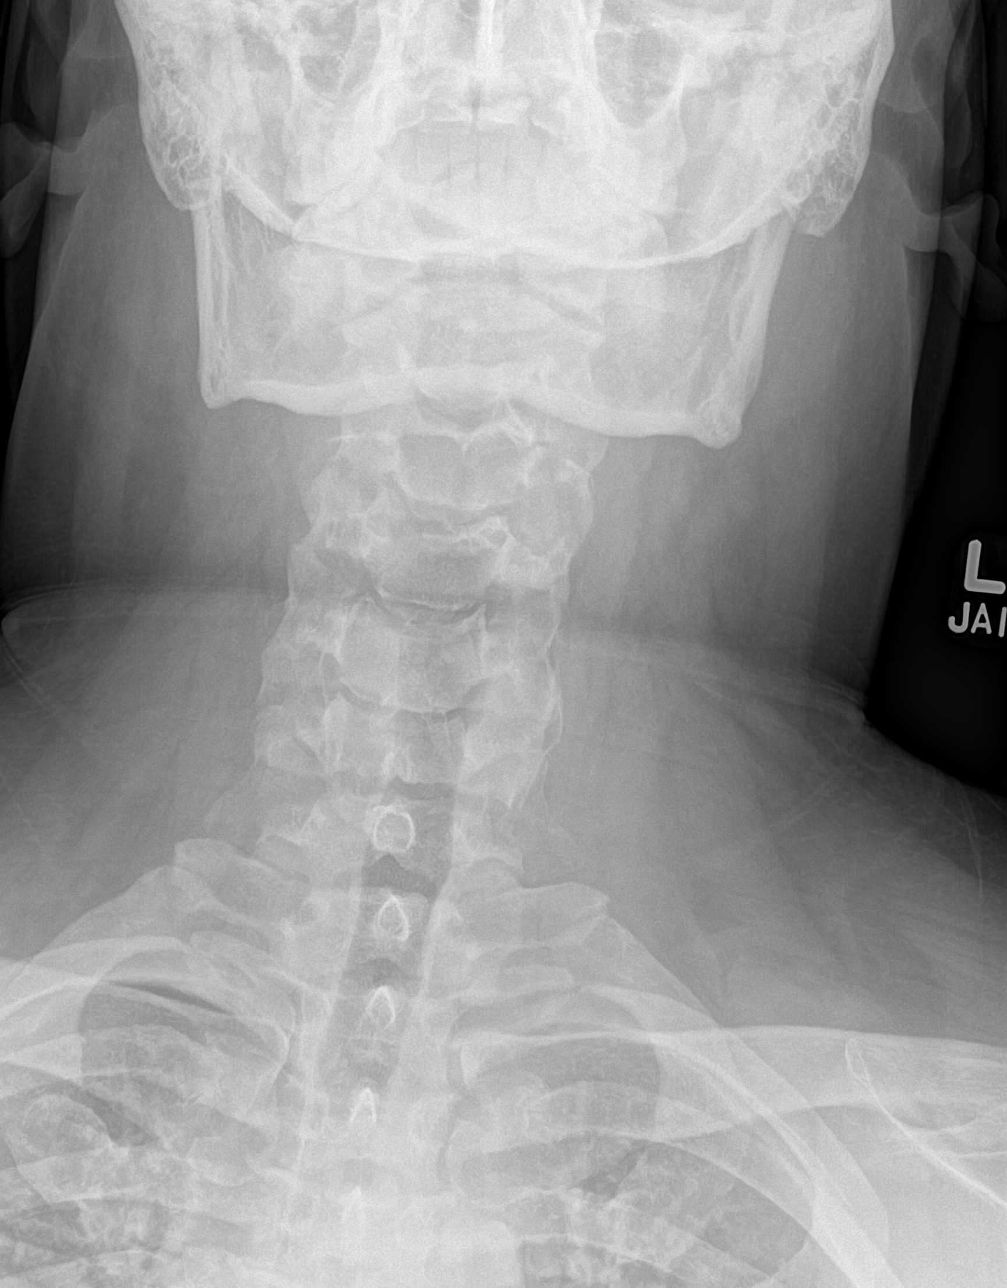

[c-spine open mouth]
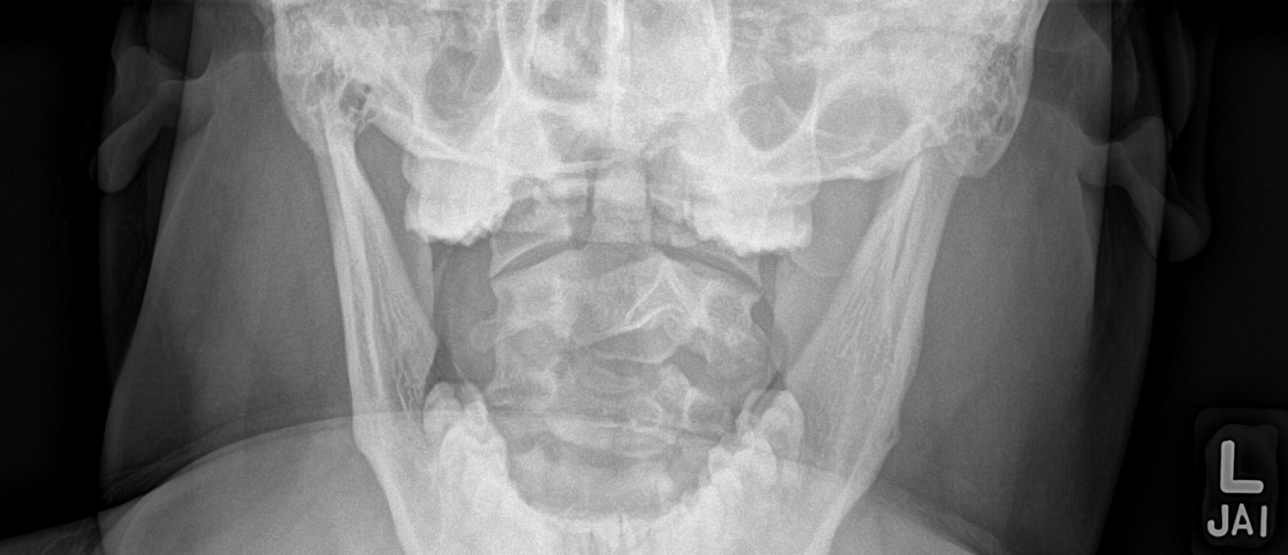

[ct-spine swimmers]
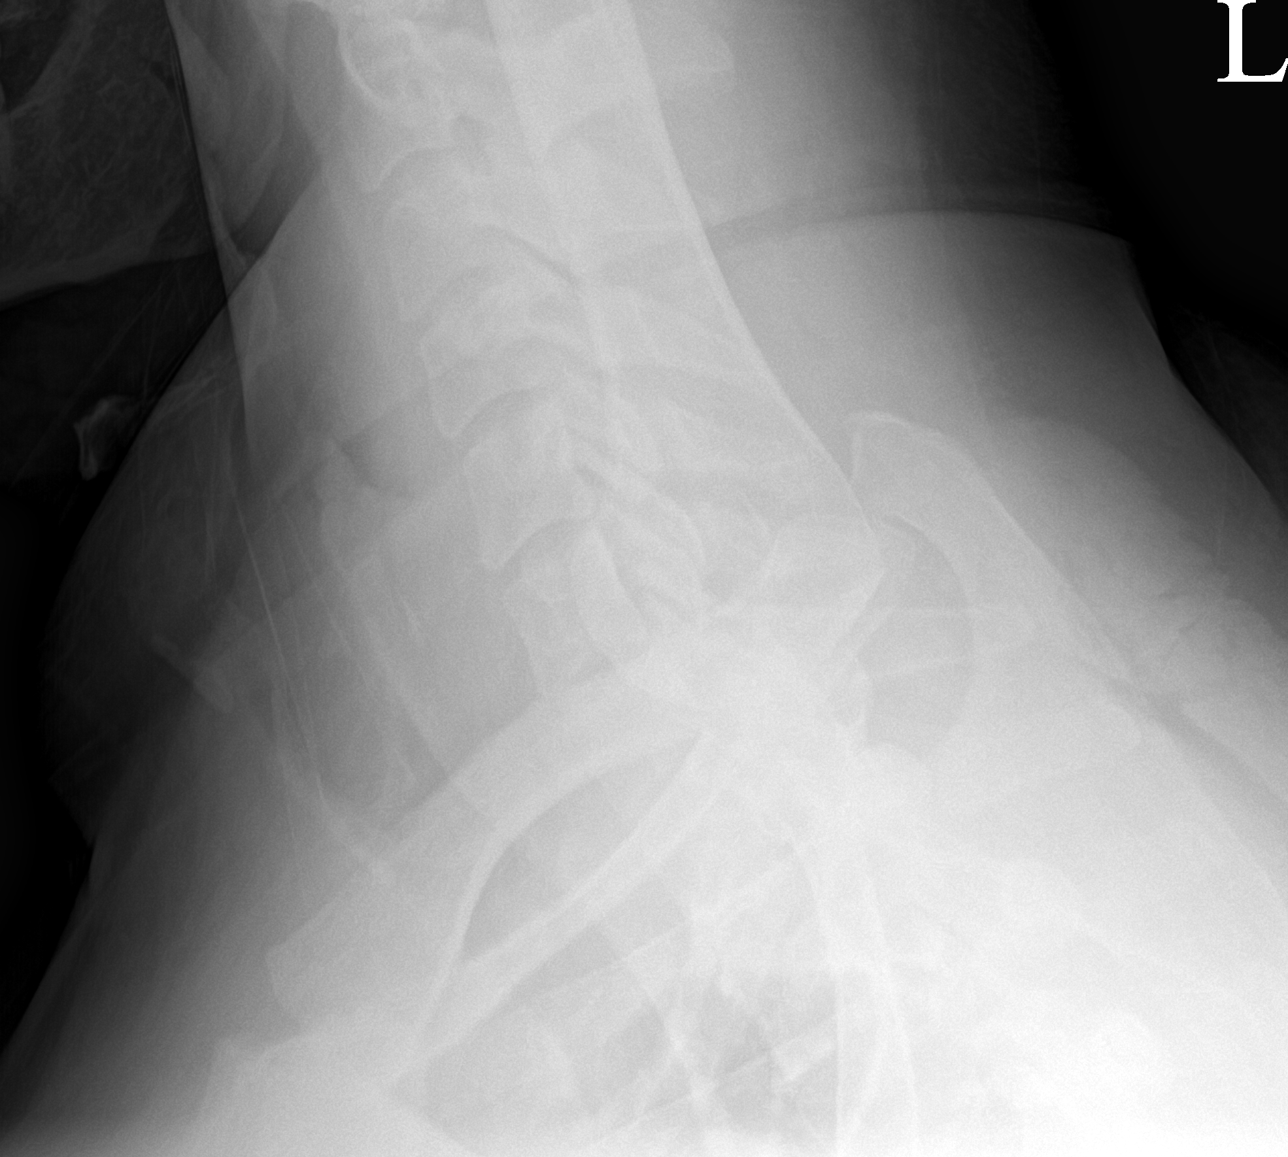

[4 of 4 positions shown; findings below may reference images not displayed]

FINDINGS: No sagittal spondylolisthesis. The atlantodens interval is intact.
Vertebral body heights are maintained. Minimal C3-4 disc space
narrowing and anterior endplate osteophytes. The lateral masses of
C1 are symmetrically aligned with the dens on the open-mouth
odontoid view. No prevertebral soft tissue swelling.
IMPRESSION: Normal alignment.

Minimal C3-4 disc space narrowing and anterior endplate osteophytes.

## 2022-12-10 IMAGING — DX DG SHOULDER 2+V*R*
3 series · 3 of 3 positions shown · non-contrast
Comparison: None Available.

CLINICAL DATA: Chronic neck pain radiates into right shoulder.

EXAM:
RIGHT SHOULDER - 2+ VIEW

[shoulder ap (1 of 2)]
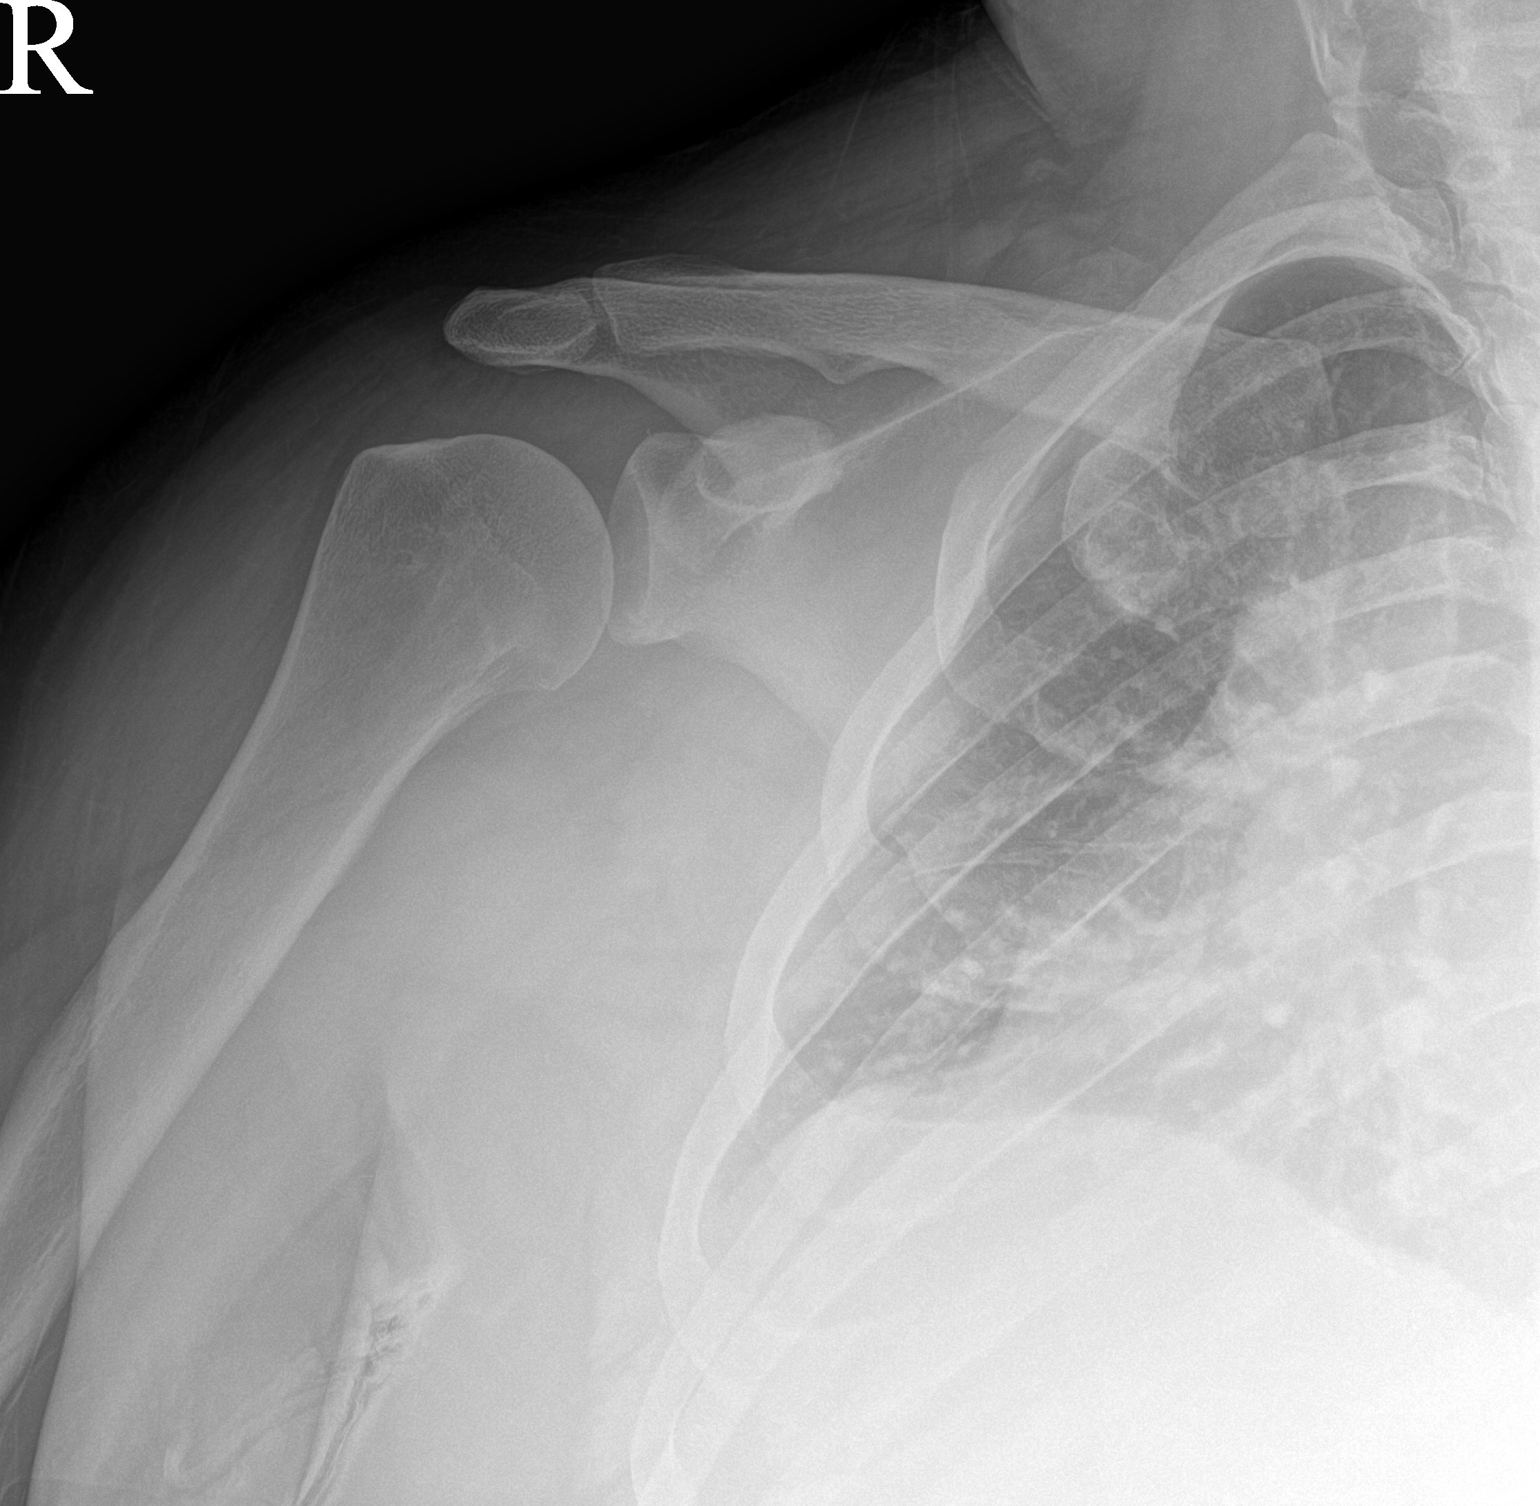

[shoulder ap (2 of 2)]
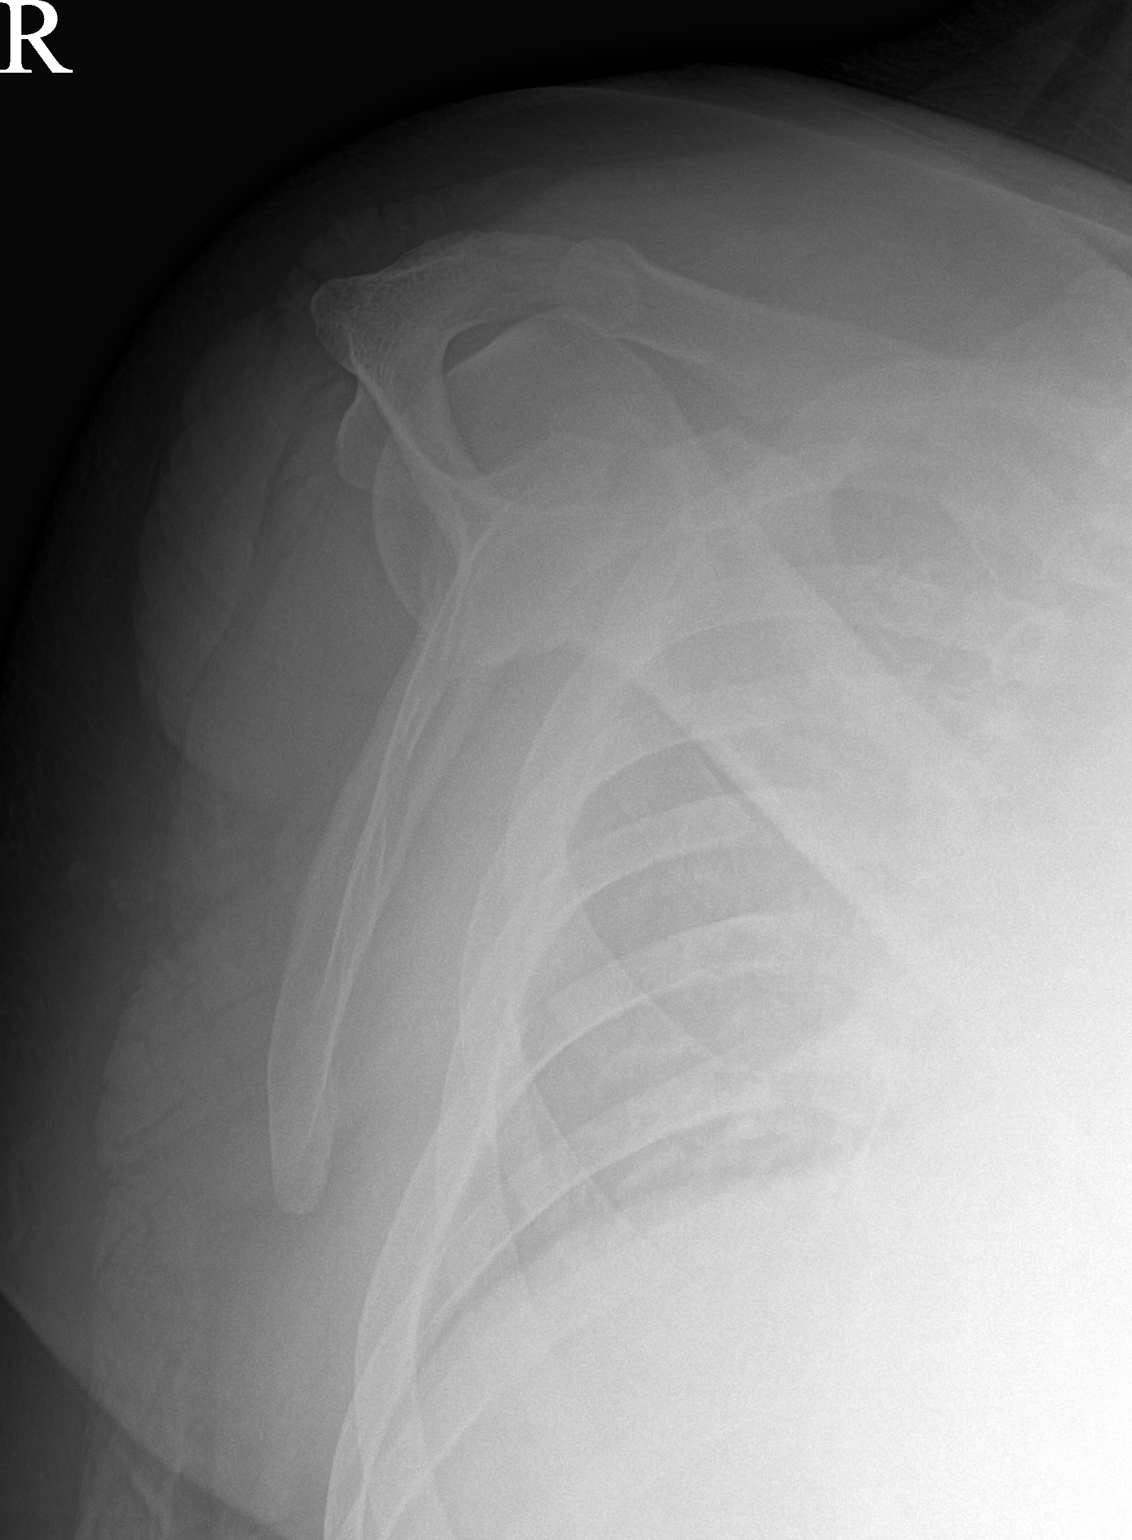

[shoulder axial]
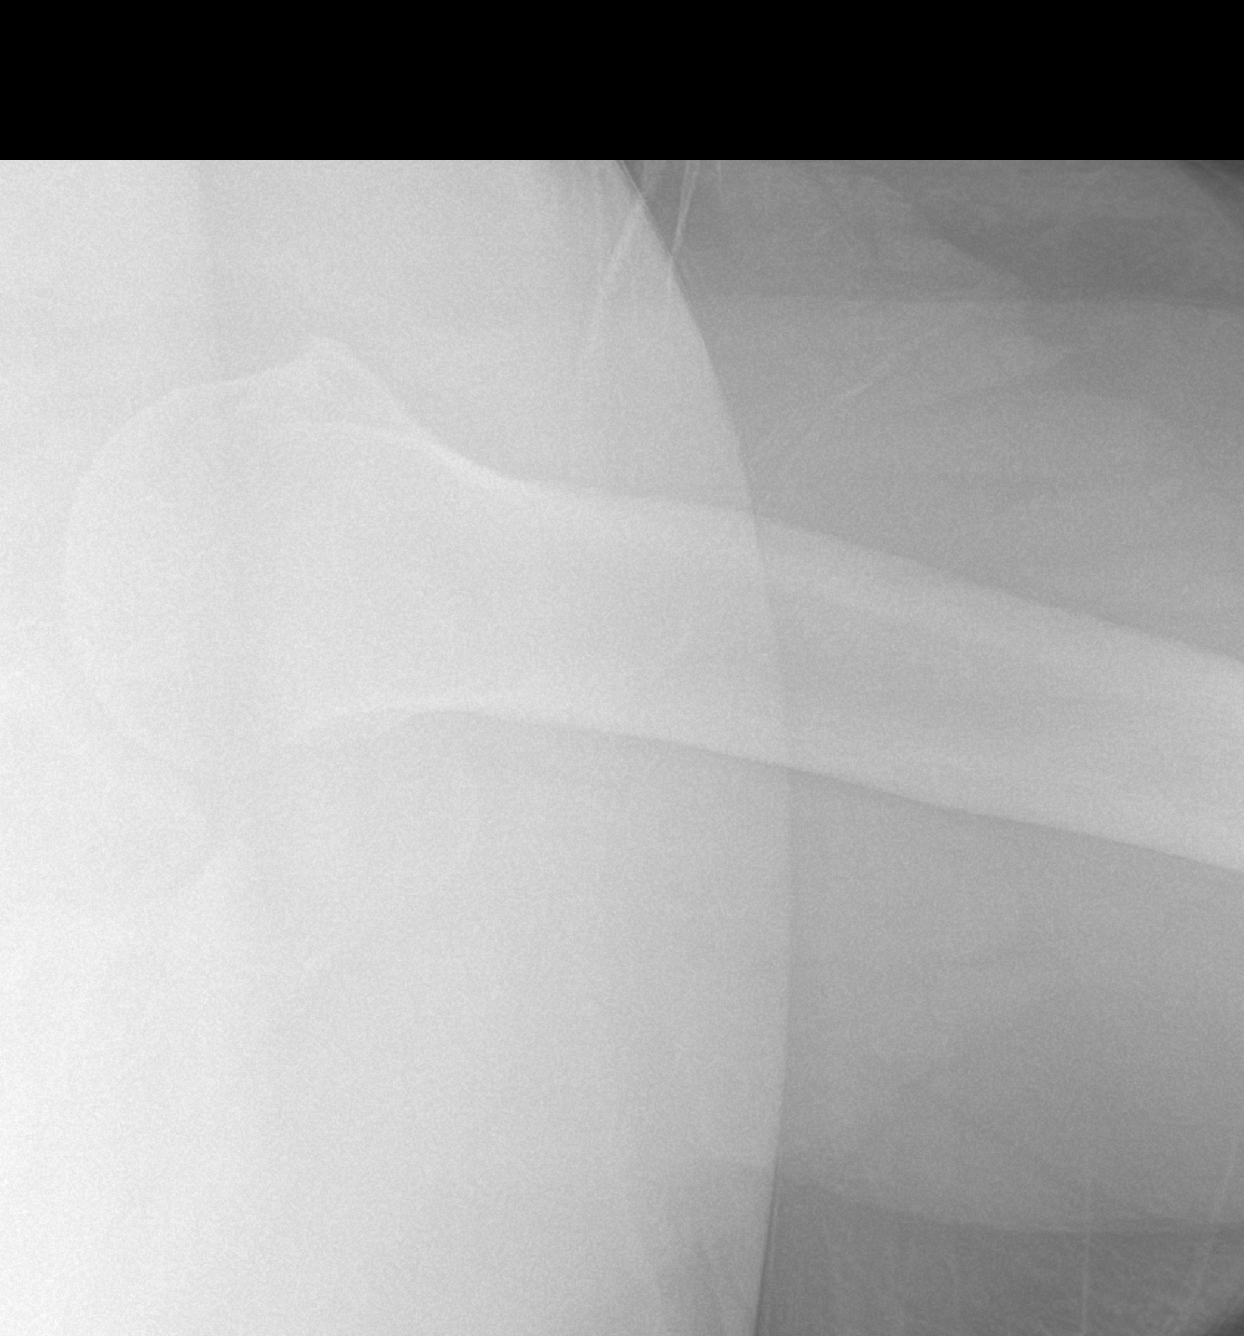

[3 of 3 positions shown; findings below may reference images not displayed]

FINDINGS: The glenohumeral and acromioclavicular joints are appropriately
aligned with joint spaces maintained. No acute fracture or
dislocation. The visualized portion of the right lung is
unremarkable.
IMPRESSION: Normal right shoulder radiographs.

## 2023-01-22 ENCOUNTER — Other Ambulatory Visit: Payer: Self-pay | Admitting: Family Medicine

## 2023-01-22 NOTE — Telephone Encounter (Signed)
Rx refill request approved per Dr. Corey's orders. 

## 2023-03-01 ENCOUNTER — Telehealth: Payer: Self-pay | Admitting: Primary Care

## 2023-03-01 NOTE — Telephone Encounter (Signed)
Called in bc Pa for Cpap device has been denied

## 2023-03-06 ENCOUNTER — Other Ambulatory Visit: Payer: Self-pay | Admitting: Family Medicine

## 2023-03-06 NOTE — Telephone Encounter (Signed)
This goes through the DME companys I have called the number listed they couldn't help me cause I didn't initiated the auth

## 2023-03-06 NOTE — Telephone Encounter (Signed)
Rx refill request approved per Dr. Corey's orders. 

## 2023-03-06 NOTE — Telephone Encounter (Signed)
PCC's- please check and see what we need to do so pt gets his CPAP, thank you.

## 2023-03-06 NOTE — Telephone Encounter (Signed)
ATC LVMTCB x 1. DME company would have to handle PA denial and appeal. Those are not handled by the office.

## 2023-04-08 ENCOUNTER — Other Ambulatory Visit: Payer: Self-pay | Admitting: Family Medicine

## 2023-04-08 NOTE — Telephone Encounter (Signed)
Rx refill request approved per Dr. Corey's orders. 

## 2023-05-02 ENCOUNTER — Other Ambulatory Visit: Payer: Self-pay | Admitting: Family Medicine

## 2023-05-02 NOTE — Telephone Encounter (Signed)
Last OV 04/02/22 Cancelled OV 05/25/22 Next OV not scheduled  Pt has not been seen in over 1 year.
